# Patient Record
Sex: Female | Born: 1935 | Race: Black or African American | Hispanic: No | Smoking: Never smoker
Health system: Southern US, Community
[De-identification: ages and names within clinical notes are randomized; demographics above are authoritative.]

## PROBLEM LIST (undated history)

## (undated) DIAGNOSIS — H409 Unspecified glaucoma: Secondary | ICD-10-CM

## (undated) DIAGNOSIS — I10 Essential (primary) hypertension: Secondary | ICD-10-CM

## (undated) HISTORY — DX: Unspecified glaucoma: H40.9

## (undated) HISTORY — DX: Essential (primary) hypertension: I10

---

## 2016-02-15 LAB — BASIC METABOLIC PANEL
BUN: 21 (ref 4–21)
Creatinine: 1 (ref 0.5–1.1)
GLUCOSE: 89
Potassium: 3.4 (ref 3.4–5.3)
SODIUM: 143 (ref 137–147)

## 2016-02-15 LAB — HEPATIC FUNCTION PANEL
ALK PHOS: 55 (ref 25–125)
ALT: 7 (ref 7–35)
AST: 16 (ref 13–35)
BILIRUBIN, TOTAL: 0.8

## 2016-02-15 LAB — CBC AND DIFFERENTIAL
HEMATOCRIT: 29 — AB (ref 36–46)
HEMOGLOBIN: 9.6 — AB (ref 12.0–16.0)
NEUTROS ABS: 2766
Platelets: 300 (ref 150–399)
WBC: 5.2

## 2017-10-10 ENCOUNTER — Encounter: Payer: Self-pay | Admitting: Internal Medicine

## 2017-10-10 ENCOUNTER — Ambulatory Visit (INDEPENDENT_AMBULATORY_CARE_PROVIDER_SITE_OTHER): Payer: Medicare Other | Admitting: Internal Medicine

## 2017-10-10 VITALS — BP 120/62 | HR 66 | Temp 98.8°F | Ht 60.0 in | Wt 108.0 lb

## 2017-10-10 DIAGNOSIS — Z23 Encounter for immunization: Secondary | ICD-10-CM | POA: Diagnosis not present

## 2017-10-10 DIAGNOSIS — F0280 Dementia in other diseases classified elsewhere without behavioral disturbance: Secondary | ICD-10-CM | POA: Diagnosis not present

## 2017-10-10 DIAGNOSIS — I10 Essential (primary) hypertension: Secondary | ICD-10-CM

## 2017-10-10 DIAGNOSIS — G301 Alzheimer's disease with late onset: Secondary | ICD-10-CM

## 2017-10-10 DIAGNOSIS — F0390 Unspecified dementia without behavioral disturbance: Secondary | ICD-10-CM | POA: Insufficient documentation

## 2017-10-10 NOTE — Patient Instructions (Signed)
We have given you the flu shot today.   We will see you back in about 6-12 months for a check in.   Consider reading the 72 hour day about dementia if you have not already.

## 2017-10-10 NOTE — Progress Notes (Signed)
   Subjective:    Patient ID: Sheila Ford, female    DOB: 1921/11/27, 84 y.o.   MRN: [id number]  HPI The patient is a new 84 YO female coming in for continuation of her care. She does have high blood pressure and has been taking amlodipine for many years without problems. She does also have dementia which is moderately advanced. They do not have her on medication for this and do not want to start another medication today. She is present with her 2 daughters and lives with 1 daughter and they help to provide history. Denies any new concerns or symptoms. They only recall her complaining about shoulder pain and known arthritis there.   [facility], social history reviewed and updated.  Review of Systems  Unable to perform ROS: Dementia  Constitutional: Negative.   Respiratory: Negative for cough, chest tightness and shortness of breath.   Cardiovascular: Negative for chest pain, palpitations and leg swelling.  Gastrointestinal: Negative for abdominal distention, abdominal pain, constipation, diarrhea, nausea and vomiting.  Musculoskeletal: Positive for arthralgias.      Objective:   Physical Exam  Constitutional: She appears well-developed and well-nourished.  HENT:  Head: Normocephalic and atraumatic.  Eyes: EOM are normal.  Neck: Normal range of motion.  Cardiovascular: Normal rate and regular rhythm.  Pulmonary/Chest: Effort normal and breath sounds normal. No respiratory distress. She has no wheezes. She has no rales.  Abdominal: Soft. Bowel sounds are normal. She exhibits no distension. There is no tenderness. There is no rebound.  Musculoskeletal: She exhibits no edema.  Neurological: She is alert. Coordination normal.  Can follow 1 step direction.   Skin: Skin is warm and dry.  Psychiatric:  Does not follow conversation often, answers direct questions.    Vitals:   10/10/17 1034  BP: 120/62  Pulse: 66  Temp: 98.8 F (37.1 C)  TempSrc: Oral  Weight: 108 lb (49 kg)    Height: 5' (1.524 m)      Assessment & Plan:  Flu shot given at visit
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
## 2017-10-10 NOTE — Assessment & Plan Note (Signed)
Taking amlodipine and getting records with last EKG and labs from prior PCP.

## 2017-10-18 ENCOUNTER — Encounter: Payer: Self-pay | Admitting: Internal Medicine

## 2017-12-08 ENCOUNTER — Telehealth: Payer: Self-pay | Admitting: Internal Medicine

## 2017-12-08 NOTE — Telephone Encounter (Signed)
Done

## 2017-12-08 NOTE — Telephone Encounter (Signed)
LOV was on 10/10/17, Form has been placed in your box to review and advise on. Thank you.

## 2017-12-08 NOTE — Telephone Encounter (Signed)
Pt's Dtr, Jola BabinskiMarilyn, [facility].  Please call Jola BabinskiMarilyn to come pick up completed form [phone].  Form put in Brittany's box.

## 2017-12-08 NOTE — Telephone Encounter (Signed)
Patient informed the forms are completed & ready to be picked up.   Copy sent to scan.

## 2018-07-20 ENCOUNTER — Ambulatory Visit (INDEPENDENT_AMBULATORY_CARE_PROVIDER_SITE_OTHER): Payer: Medicare Other | Admitting: Internal Medicine

## 2018-07-20 ENCOUNTER — Encounter: Payer: Self-pay | Admitting: Internal Medicine

## 2018-07-20 VITALS — BP 112/70 | HR 66 | Temp 98.6°F | Ht 60.0 in | Wt 102.0 lb

## 2018-07-20 DIAGNOSIS — Z23 Encounter for immunization: Secondary | ICD-10-CM

## 2018-07-20 DIAGNOSIS — F0280 Dementia in other diseases classified elsewhere without behavioral disturbance: Secondary | ICD-10-CM | POA: Diagnosis not present

## 2018-07-20 DIAGNOSIS — G301 Alzheimer's disease with late onset: Secondary | ICD-10-CM

## 2018-07-20 NOTE — Addendum Note (Signed)
Addended by: Berton LanGULCH, Dafna Romo R on: 07/20/2018 02:38 PM   Modules accepted: Orders

## 2018-07-20 NOTE — Progress Notes (Signed)
   Subjective:    Patient ID: Sheila Ford, female    DOB: 03-18-1922, 84 y.o.   MRN: [id number]  HPI The patient is a 84 YO female coming in for concerns about her safety in living alone. Her daughter is present and helps to give all the history. She is having progression of dementia. They are just not able to stay with her all the time. She does not have problems with aggression or wandering. She is still able to dress and bathe herself as well as feed herself. Not incontinent.   Review of Systems  Unable to perform ROS: Dementia      Objective:   Physical Exam  Constitutional: She appears well-developed and well-nourished.  HENT:  Head: Normocephalic and atraumatic.  Eyes: EOM are normal.  Neck: Normal range of motion.  Cardiovascular: Normal rate and regular rhythm.  Pulmonary/Chest: Effort normal and breath sounds normal. No respiratory distress. She has no wheezes. She has no rales.  Abdominal: Soft. Bowel sounds are normal. She exhibits no distension. There is no tenderness. There is no rebound.  Musculoskeletal: She exhibits no edema.  Neurological: She is alert. Coordination normal.  Skin: Skin is warm and dry.   Vitals:   07/20/18 1329  BP: 112/70  Pulse: 66  Temp: 98.6 F (37 C)  TempSrc: Oral  SpO2: 98%  Weight: 102 lb (46.3 kg)  Height: 5' (1.524 m)      Assessment & Plan:

## 2018-07-20 NOTE — Assessment & Plan Note (Signed)
Worsening over the last year and she is needing more assistance with living. FL2 filled out and given to daughter at visit so they can apply to [facility].

## 2018-08-23 ENCOUNTER — Telehealth: Payer: Self-pay | Admitting: Internal Medicine

## 2018-08-23 NOTE — Telephone Encounter (Signed)
Patient's daughter informed that FL2 filled out and placed up front for pick up

## 2018-08-23 NOTE — Telephone Encounter (Signed)
Fine for [place] to fill out

## 2018-08-23 NOTE — Telephone Encounter (Signed)
Placed in MD basket to finish and sign

## 2018-08-23 NOTE — Telephone Encounter (Signed)
Copied from CRM [id number]. Topic: General - Inquiry >> Aug 23, 2018  8:54 AM Maia Petties wrote: Reason for CRM: Pt daughter, Jola Babinski, called stating pt will be going to [facility] memory care unit. She is needing a new FL2 because it is only good for 30 days and was completed on 07/20/2018. She is asking if Dr. Okey Dupre can complete a new one and she will come to pick up when ready. Pt is hoping to be admitted to the facility 08/30/2018. Please call Jola Babinski at [phone].

## 2018-09-17 DIAGNOSIS — G301 Alzheimer's disease with late onset: Secondary | ICD-10-CM | POA: Diagnosis not present

## 2018-09-17 DIAGNOSIS — I10 Essential (primary) hypertension: Secondary | ICD-10-CM | POA: Diagnosis not present

## 2018-09-17 DIAGNOSIS — F0281 Dementia in other diseases classified elsewhere with behavioral disturbance: Secondary | ICD-10-CM | POA: Diagnosis not present

## 2018-10-03 DIAGNOSIS — Z961 Presence of intraocular lens: Secondary | ICD-10-CM | POA: Diagnosis not present

## 2018-10-03 DIAGNOSIS — H02105 Unspecified ectropion of left lower eyelid: Secondary | ICD-10-CM | POA: Diagnosis not present

## 2018-10-08 DIAGNOSIS — G301 Alzheimer's disease with late onset: Secondary | ICD-10-CM | POA: Diagnosis not present

## 2018-10-08 DIAGNOSIS — I10 Essential (primary) hypertension: Secondary | ICD-10-CM | POA: Diagnosis not present

## 2018-10-08 DIAGNOSIS — F0281 Dementia in other diseases classified elsewhere with behavioral disturbance: Secondary | ICD-10-CM | POA: Diagnosis not present

## 2018-10-11 ENCOUNTER — Ambulatory Visit (INDEPENDENT_AMBULATORY_CARE_PROVIDER_SITE_OTHER): Payer: Medicare Other | Admitting: Internal Medicine

## 2018-10-11 ENCOUNTER — Encounter: Payer: Self-pay | Admitting: Internal Medicine

## 2018-10-11 VITALS — BP 140/60 | HR 48 | Temp 98.6°F | Ht 60.0 in | Wt 102.0 lb

## 2018-10-11 DIAGNOSIS — Z0000 Encounter for general adult medical examination without abnormal findings: Secondary | ICD-10-CM | POA: Insufficient documentation

## 2018-10-11 DIAGNOSIS — F0280 Dementia in other diseases classified elsewhere without behavioral disturbance: Secondary | ICD-10-CM | POA: Diagnosis not present

## 2018-10-11 DIAGNOSIS — G301 Alzheimer's disease with late onset: Secondary | ICD-10-CM | POA: Diagnosis not present

## 2018-10-11 DIAGNOSIS — I10 Essential (primary) hypertension: Secondary | ICD-10-CM

## 2018-10-11 NOTE — Assessment & Plan Note (Signed)
Stable and in facility at this time. She is not on medication and they do not want to start.

## 2018-10-11 NOTE — Patient Instructions (Signed)

## 2018-10-11 NOTE — Assessment & Plan Note (Signed)
BP at goal on amlodipine 5 mg daily. No side effects and declines labs.

## 2018-10-11 NOTE — Progress Notes (Signed)
   Subjective:   Patient ID: Sheila Ford, female    DOB: [birth year], 85 y.o.   MRN: [id number]  HPI Here for medicare wellness, no new complaints. Please see A/P for status and treatment of chronic medical problems.   HPI #2: here for follow up blood pressure (taking amlodipine 5 mg daily, denies side effects, no chest pains or headaches per family), and dementia (moderate and worsening over the last year, moved into facility for help as living with daughter previously and they were unable to leave her alone anymore, doing well in facility and eating okay, no new complaints).   Diet: heart healthy Physical activity: sedentary Depression/mood screen: negative Hearing: intact to whispered voice, mild to moderate loss bilaterally Visual acuity: grossly normal with lens, performs annual eye exam  ADLs: capable, some assist with bathing Fall risk: low Home safety: poor but lives in facility to help with safety Cognitive evaluation: intact to name EOL planning: adv directives discussed, in place    Office Visit from 07/20/2018 in [facility]  PHQ-2 Total Score  0      I have personally reviewed and have noted 1. The patient's medical and social history - reviewed today no changes 2. Their use of alcohol, tobacco or illicit drugs 3. Their current medications and supplements 4. The patient's functional ability including ADL's, fall risks, home safety risks and hearing or visual impairment. 5. Diet and physical activities 6. Evidence for depression or mood disorders 7. Care team reviewed and updated (available in snapshot)  Review of Systems  Unable to perform ROS: Dementia    Objective:  Physical Exam Constitutional:      Appearance: She is well-developed.  HENT:     Head: Normocephalic and atraumatic.  Neck:     Musculoskeletal: Normal range of motion.  Cardiovascular:     Rate and Rhythm: Normal rate and regular rhythm.  Pulmonary:     Effort:  Pulmonary effort is normal. No respiratory distress.     Breath sounds: Normal breath sounds. No wheezing or rales.  Abdominal:     General: Bowel sounds are normal. There is no distension.     Palpations: Abdomen is soft.     Tenderness: There is no abdominal tenderness. There is no rebound.  Skin:    General: Skin is warm and dry.  Neurological:     Mental Status: She is alert. Mental status is at baseline.     Coordination: Coordination normal.     Vitals:   10/11/18 1041  BP: 140/60  Pulse: (!) 48  Temp: 98.6 F (37 C)  TempSrc: Oral  SpO2: 99%  Weight: 102 lb (46.3 kg)  Height: 5' (1.524 m)    Assessment & Plan:

## 2018-10-11 NOTE — Assessment & Plan Note (Signed)
Flu shot up to date. Pneumonia declines. Shingrix declines. Tetanus declines. Colonoscopy aged out. Mammogram aged out, pap smear aged out and dexa declines. Counseled about sun safety and mole surveillance. Counseled about the dangers of distracted driving. Given 10 year screening recommendations.  

## 2018-10-12 DIAGNOSIS — M6281 Muscle weakness (generalized): Secondary | ICD-10-CM | POA: Diagnosis not present

## 2018-10-12 DIAGNOSIS — R293 Abnormal posture: Secondary | ICD-10-CM | POA: Diagnosis not present

## 2018-10-12 DIAGNOSIS — R278 Other lack of coordination: Secondary | ICD-10-CM | POA: Diagnosis not present

## 2018-10-16 DIAGNOSIS — M6281 Muscle weakness (generalized): Secondary | ICD-10-CM | POA: Diagnosis not present

## 2018-10-16 DIAGNOSIS — R293 Abnormal posture: Secondary | ICD-10-CM | POA: Diagnosis not present

## 2018-10-16 DIAGNOSIS — R419 Unspecified symptoms and signs involving cognitive functions and awareness: Secondary | ICD-10-CM | POA: Diagnosis not present

## 2018-10-16 DIAGNOSIS — R278 Other lack of coordination: Secondary | ICD-10-CM | POA: Diagnosis not present

## 2018-10-16 DIAGNOSIS — F419 Anxiety disorder, unspecified: Secondary | ICD-10-CM | POA: Diagnosis not present

## 2018-10-17 DIAGNOSIS — R293 Abnormal posture: Secondary | ICD-10-CM | POA: Diagnosis not present

## 2018-10-17 DIAGNOSIS — M6281 Muscle weakness (generalized): Secondary | ICD-10-CM | POA: Diagnosis not present

## 2018-10-17 DIAGNOSIS — R278 Other lack of coordination: Secondary | ICD-10-CM | POA: Diagnosis not present

## 2018-10-18 DIAGNOSIS — R293 Abnormal posture: Secondary | ICD-10-CM | POA: Diagnosis not present

## 2018-10-18 DIAGNOSIS — M6281 Muscle weakness (generalized): Secondary | ICD-10-CM | POA: Diagnosis not present

## 2018-10-18 DIAGNOSIS — R278 Other lack of coordination: Secondary | ICD-10-CM | POA: Diagnosis not present

## 2018-10-19 DIAGNOSIS — R2681 Unsteadiness on feet: Secondary | ICD-10-CM | POA: Diagnosis not present

## 2018-10-19 DIAGNOSIS — M6281 Muscle weakness (generalized): Secondary | ICD-10-CM | POA: Diagnosis not present

## 2018-10-22 DIAGNOSIS — I10 Essential (primary) hypertension: Secondary | ICD-10-CM | POA: Diagnosis not present

## 2018-10-22 DIAGNOSIS — G301 Alzheimer's disease with late onset: Secondary | ICD-10-CM | POA: Diagnosis not present

## 2018-10-22 DIAGNOSIS — R2681 Unsteadiness on feet: Secondary | ICD-10-CM | POA: Diagnosis not present

## 2018-10-22 DIAGNOSIS — M6281 Muscle weakness (generalized): Secondary | ICD-10-CM | POA: Diagnosis not present

## 2018-10-22 DIAGNOSIS — F0281 Dementia in other diseases classified elsewhere with behavioral disturbance: Secondary | ICD-10-CM | POA: Diagnosis not present

## 2018-10-22 DIAGNOSIS — R278 Other lack of coordination: Secondary | ICD-10-CM | POA: Diagnosis not present

## 2018-10-22 DIAGNOSIS — R293 Abnormal posture: Secondary | ICD-10-CM | POA: Diagnosis not present

## 2018-10-23 DIAGNOSIS — R278 Other lack of coordination: Secondary | ICD-10-CM | POA: Diagnosis not present

## 2018-10-23 DIAGNOSIS — R2681 Unsteadiness on feet: Secondary | ICD-10-CM | POA: Diagnosis not present

## 2018-10-23 DIAGNOSIS — R293 Abnormal posture: Secondary | ICD-10-CM | POA: Diagnosis not present

## 2018-10-23 DIAGNOSIS — M6281 Muscle weakness (generalized): Secondary | ICD-10-CM | POA: Diagnosis not present

## 2018-10-25 DIAGNOSIS — R293 Abnormal posture: Secondary | ICD-10-CM | POA: Diagnosis not present

## 2018-10-25 DIAGNOSIS — R278 Other lack of coordination: Secondary | ICD-10-CM | POA: Diagnosis not present

## 2018-10-25 DIAGNOSIS — M6281 Muscle weakness (generalized): Secondary | ICD-10-CM | POA: Diagnosis not present

## 2018-10-29 DIAGNOSIS — R293 Abnormal posture: Secondary | ICD-10-CM | POA: Diagnosis not present

## 2018-10-29 DIAGNOSIS — M6281 Muscle weakness (generalized): Secondary | ICD-10-CM | POA: Diagnosis not present

## 2018-10-29 DIAGNOSIS — R2681 Unsteadiness on feet: Secondary | ICD-10-CM | POA: Diagnosis not present

## 2018-10-29 DIAGNOSIS — R278 Other lack of coordination: Secondary | ICD-10-CM | POA: Diagnosis not present

## 2018-10-30 DIAGNOSIS — R278 Other lack of coordination: Secondary | ICD-10-CM | POA: Diagnosis not present

## 2018-10-30 DIAGNOSIS — R293 Abnormal posture: Secondary | ICD-10-CM | POA: Diagnosis not present

## 2018-10-30 DIAGNOSIS — M6281 Muscle weakness (generalized): Secondary | ICD-10-CM | POA: Diagnosis not present

## 2018-10-30 DIAGNOSIS — R2681 Unsteadiness on feet: Secondary | ICD-10-CM | POA: Diagnosis not present

## 2018-11-01 DIAGNOSIS — R2681 Unsteadiness on feet: Secondary | ICD-10-CM | POA: Diagnosis not present

## 2018-11-01 DIAGNOSIS — R278 Other lack of coordination: Secondary | ICD-10-CM | POA: Diagnosis not present

## 2018-11-01 DIAGNOSIS — M6281 Muscle weakness (generalized): Secondary | ICD-10-CM | POA: Diagnosis not present

## 2018-11-01 DIAGNOSIS — R293 Abnormal posture: Secondary | ICD-10-CM | POA: Diagnosis not present

## 2018-11-05 DIAGNOSIS — R293 Abnormal posture: Secondary | ICD-10-CM | POA: Diagnosis not present

## 2018-11-05 DIAGNOSIS — M6281 Muscle weakness (generalized): Secondary | ICD-10-CM | POA: Diagnosis not present

## 2018-11-05 DIAGNOSIS — R278 Other lack of coordination: Secondary | ICD-10-CM | POA: Diagnosis not present

## 2018-11-05 DIAGNOSIS — R2681 Unsteadiness on feet: Secondary | ICD-10-CM | POA: Diagnosis not present

## 2018-11-06 DIAGNOSIS — R278 Other lack of coordination: Secondary | ICD-10-CM | POA: Diagnosis not present

## 2018-11-06 DIAGNOSIS — R293 Abnormal posture: Secondary | ICD-10-CM | POA: Diagnosis not present

## 2018-11-06 DIAGNOSIS — M6281 Muscle weakness (generalized): Secondary | ICD-10-CM | POA: Diagnosis not present

## 2018-11-06 DIAGNOSIS — R2681 Unsteadiness on feet: Secondary | ICD-10-CM | POA: Diagnosis not present

## 2018-11-08 DIAGNOSIS — M6281 Muscle weakness (generalized): Secondary | ICD-10-CM | POA: Diagnosis not present

## 2018-11-08 DIAGNOSIS — R2681 Unsteadiness on feet: Secondary | ICD-10-CM | POA: Diagnosis not present

## 2018-11-09 DIAGNOSIS — R293 Abnormal posture: Secondary | ICD-10-CM | POA: Diagnosis not present

## 2018-11-09 DIAGNOSIS — R2681 Unsteadiness on feet: Secondary | ICD-10-CM | POA: Diagnosis not present

## 2018-11-09 DIAGNOSIS — M6281 Muscle weakness (generalized): Secondary | ICD-10-CM | POA: Diagnosis not present

## 2018-11-09 DIAGNOSIS — R278 Other lack of coordination: Secondary | ICD-10-CM | POA: Diagnosis not present

## 2018-11-12 DIAGNOSIS — M6281 Muscle weakness (generalized): Secondary | ICD-10-CM | POA: Diagnosis not present

## 2018-11-12 DIAGNOSIS — R2681 Unsteadiness on feet: Secondary | ICD-10-CM | POA: Diagnosis not present

## 2018-11-13 DIAGNOSIS — M6281 Muscle weakness (generalized): Secondary | ICD-10-CM | POA: Diagnosis not present

## 2018-11-13 DIAGNOSIS — R278 Other lack of coordination: Secondary | ICD-10-CM | POA: Diagnosis not present

## 2018-11-13 DIAGNOSIS — R2681 Unsteadiness on feet: Secondary | ICD-10-CM | POA: Diagnosis not present

## 2018-11-13 DIAGNOSIS — R293 Abnormal posture: Secondary | ICD-10-CM | POA: Diagnosis not present

## 2018-11-14 DIAGNOSIS — R293 Abnormal posture: Secondary | ICD-10-CM | POA: Diagnosis not present

## 2018-11-14 DIAGNOSIS — R278 Other lack of coordination: Secondary | ICD-10-CM | POA: Diagnosis not present

## 2018-11-14 DIAGNOSIS — M6281 Muscle weakness (generalized): Secondary | ICD-10-CM | POA: Diagnosis not present

## 2018-11-20 DIAGNOSIS — R278 Other lack of coordination: Secondary | ICD-10-CM | POA: Diagnosis not present

## 2018-11-20 DIAGNOSIS — R2681 Unsteadiness on feet: Secondary | ICD-10-CM | POA: Diagnosis not present

## 2018-11-20 DIAGNOSIS — R293 Abnormal posture: Secondary | ICD-10-CM | POA: Diagnosis not present

## 2018-11-20 DIAGNOSIS — M6281 Muscle weakness (generalized): Secondary | ICD-10-CM | POA: Diagnosis not present

## 2018-11-21 DIAGNOSIS — M6281 Muscle weakness (generalized): Secondary | ICD-10-CM | POA: Diagnosis not present

## 2018-11-21 DIAGNOSIS — R293 Abnormal posture: Secondary | ICD-10-CM | POA: Diagnosis not present

## 2018-11-21 DIAGNOSIS — R2681 Unsteadiness on feet: Secondary | ICD-10-CM | POA: Diagnosis not present

## 2018-11-21 DIAGNOSIS — R278 Other lack of coordination: Secondary | ICD-10-CM | POA: Diagnosis not present

## 2018-11-22 DIAGNOSIS — R293 Abnormal posture: Secondary | ICD-10-CM | POA: Diagnosis not present

## 2018-11-22 DIAGNOSIS — M6281 Muscle weakness (generalized): Secondary | ICD-10-CM | POA: Diagnosis not present

## 2018-11-22 DIAGNOSIS — R2681 Unsteadiness on feet: Secondary | ICD-10-CM | POA: Diagnosis not present

## 2018-11-22 DIAGNOSIS — R278 Other lack of coordination: Secondary | ICD-10-CM | POA: Diagnosis not present

## 2018-11-23 DIAGNOSIS — R293 Abnormal posture: Secondary | ICD-10-CM | POA: Diagnosis not present

## 2018-11-23 DIAGNOSIS — R278 Other lack of coordination: Secondary | ICD-10-CM | POA: Diagnosis not present

## 2018-11-23 DIAGNOSIS — M6281 Muscle weakness (generalized): Secondary | ICD-10-CM | POA: Diagnosis not present

## 2018-11-26 DIAGNOSIS — M6281 Muscle weakness (generalized): Secondary | ICD-10-CM | POA: Diagnosis not present

## 2018-11-26 DIAGNOSIS — R2681 Unsteadiness on feet: Secondary | ICD-10-CM | POA: Diagnosis not present

## 2018-11-27 DIAGNOSIS — R278 Other lack of coordination: Secondary | ICD-10-CM | POA: Diagnosis not present

## 2018-11-27 DIAGNOSIS — R2681 Unsteadiness on feet: Secondary | ICD-10-CM | POA: Diagnosis not present

## 2018-11-27 DIAGNOSIS — R293 Abnormal posture: Secondary | ICD-10-CM | POA: Diagnosis not present

## 2018-11-27 DIAGNOSIS — M6281 Muscle weakness (generalized): Secondary | ICD-10-CM | POA: Diagnosis not present

## 2018-11-29 DIAGNOSIS — R2681 Unsteadiness on feet: Secondary | ICD-10-CM | POA: Diagnosis not present

## 2018-11-29 DIAGNOSIS — R278 Other lack of coordination: Secondary | ICD-10-CM | POA: Diagnosis not present

## 2018-11-29 DIAGNOSIS — M6281 Muscle weakness (generalized): Secondary | ICD-10-CM | POA: Diagnosis not present

## 2018-11-29 DIAGNOSIS — R293 Abnormal posture: Secondary | ICD-10-CM | POA: Diagnosis not present

## 2018-11-30 DIAGNOSIS — R293 Abnormal posture: Secondary | ICD-10-CM | POA: Diagnosis not present

## 2018-11-30 DIAGNOSIS — R278 Other lack of coordination: Secondary | ICD-10-CM | POA: Diagnosis not present

## 2018-11-30 DIAGNOSIS — M6281 Muscle weakness (generalized): Secondary | ICD-10-CM | POA: Diagnosis not present

## 2018-12-03 DIAGNOSIS — R2681 Unsteadiness on feet: Secondary | ICD-10-CM | POA: Diagnosis not present

## 2018-12-03 DIAGNOSIS — M6281 Muscle weakness (generalized): Secondary | ICD-10-CM | POA: Diagnosis not present

## 2018-12-04 DIAGNOSIS — R419 Unspecified symptoms and signs involving cognitive functions and awareness: Secondary | ICD-10-CM | POA: Diagnosis not present

## 2018-12-04 DIAGNOSIS — F419 Anxiety disorder, unspecified: Secondary | ICD-10-CM | POA: Diagnosis not present

## 2018-12-04 DIAGNOSIS — M6281 Muscle weakness (generalized): Secondary | ICD-10-CM | POA: Diagnosis not present

## 2018-12-04 DIAGNOSIS — R278 Other lack of coordination: Secondary | ICD-10-CM | POA: Diagnosis not present

## 2018-12-04 DIAGNOSIS — R2681 Unsteadiness on feet: Secondary | ICD-10-CM | POA: Diagnosis not present

## 2018-12-04 DIAGNOSIS — R293 Abnormal posture: Secondary | ICD-10-CM | POA: Diagnosis not present

## 2018-12-05 DIAGNOSIS — M6281 Muscle weakness (generalized): Secondary | ICD-10-CM | POA: Diagnosis not present

## 2018-12-05 DIAGNOSIS — M79674 Pain in right toe(s): Secondary | ICD-10-CM | POA: Diagnosis not present

## 2018-12-05 DIAGNOSIS — R278 Other lack of coordination: Secondary | ICD-10-CM | POA: Diagnosis not present

## 2018-12-05 DIAGNOSIS — R293 Abnormal posture: Secondary | ICD-10-CM | POA: Diagnosis not present

## 2018-12-05 DIAGNOSIS — B351 Tinea unguium: Secondary | ICD-10-CM | POA: Diagnosis not present

## 2018-12-05 DIAGNOSIS — M79675 Pain in left toe(s): Secondary | ICD-10-CM | POA: Diagnosis not present

## 2018-12-06 DIAGNOSIS — R2681 Unsteadiness on feet: Secondary | ICD-10-CM | POA: Diagnosis not present

## 2018-12-06 DIAGNOSIS — M6281 Muscle weakness (generalized): Secondary | ICD-10-CM | POA: Diagnosis not present

## 2018-12-07 DIAGNOSIS — M6281 Muscle weakness (generalized): Secondary | ICD-10-CM | POA: Diagnosis not present

## 2018-12-07 DIAGNOSIS — R293 Abnormal posture: Secondary | ICD-10-CM | POA: Diagnosis not present

## 2018-12-07 DIAGNOSIS — R278 Other lack of coordination: Secondary | ICD-10-CM | POA: Diagnosis not present

## 2018-12-10 DIAGNOSIS — R293 Abnormal posture: Secondary | ICD-10-CM | POA: Diagnosis not present

## 2018-12-10 DIAGNOSIS — R2681 Unsteadiness on feet: Secondary | ICD-10-CM | POA: Diagnosis not present

## 2018-12-10 DIAGNOSIS — R278 Other lack of coordination: Secondary | ICD-10-CM | POA: Diagnosis not present

## 2018-12-10 DIAGNOSIS — M6281 Muscle weakness (generalized): Secondary | ICD-10-CM | POA: Diagnosis not present

## 2018-12-11 DIAGNOSIS — M6281 Muscle weakness (generalized): Secondary | ICD-10-CM | POA: Diagnosis not present

## 2018-12-11 DIAGNOSIS — R278 Other lack of coordination: Secondary | ICD-10-CM | POA: Diagnosis not present

## 2018-12-11 DIAGNOSIS — R2681 Unsteadiness on feet: Secondary | ICD-10-CM | POA: Diagnosis not present

## 2018-12-11 DIAGNOSIS — R293 Abnormal posture: Secondary | ICD-10-CM | POA: Diagnosis not present

## 2018-12-13 ENCOUNTER — Other Ambulatory Visit: Payer: Self-pay | Admitting: Internal Medicine

## 2018-12-13 DIAGNOSIS — R2681 Unsteadiness on feet: Secondary | ICD-10-CM | POA: Diagnosis not present

## 2018-12-13 DIAGNOSIS — M6281 Muscle weakness (generalized): Secondary | ICD-10-CM | POA: Diagnosis not present

## 2018-12-13 MED ORDER — AMLODIPINE BESYLATE 5 MG PO TABS: 10.0000 mg | ORAL_TABLET | Freq: Every day | ORAL | 1 refills | Status: DC

## 2018-12-13 NOTE — Telephone Encounter (Signed)
Copied from CRM [id number]. Topic: Quick Communication - Rx Refill/Question >> Dec 13, 2018 10:53 AM Dalphine Handing A wrote: Medication: amLODipine (NORVASC) 5 MG tablet  Has the patient contacted their pharmacy? Yes (Agent: If no, request that the patient contact the pharmacy for the refill.) (Agent: If yes, when and what did the pharmacy advise?) Contact PCP  Preferred Pharmacy (with phone number or street name): [id number] [place], [place] - [address] [phone] (Phone) [phone] (Fax)    Agent: Please be advised that RX refills may take up to 3 business days. We ask that you follow-up with your pharmacy.

## 2018-12-13 NOTE — Telephone Encounter (Signed)
Is it okay to refill?

## 2018-12-17 DIAGNOSIS — M6281 Muscle weakness (generalized): Secondary | ICD-10-CM | POA: Diagnosis not present

## 2018-12-17 DIAGNOSIS — R278 Other lack of coordination: Secondary | ICD-10-CM | POA: Diagnosis not present

## 2018-12-17 DIAGNOSIS — R293 Abnormal posture: Secondary | ICD-10-CM | POA: Diagnosis not present

## 2018-12-18 DIAGNOSIS — R2681 Unsteadiness on feet: Secondary | ICD-10-CM | POA: Diagnosis not present

## 2018-12-18 DIAGNOSIS — M6281 Muscle weakness (generalized): Secondary | ICD-10-CM | POA: Diagnosis not present

## 2018-12-18 DIAGNOSIS — R293 Abnormal posture: Secondary | ICD-10-CM | POA: Diagnosis not present

## 2018-12-18 DIAGNOSIS — R278 Other lack of coordination: Secondary | ICD-10-CM | POA: Diagnosis not present

## 2018-12-19 DIAGNOSIS — R278 Other lack of coordination: Secondary | ICD-10-CM | POA: Diagnosis not present

## 2018-12-19 DIAGNOSIS — M6281 Muscle weakness (generalized): Secondary | ICD-10-CM | POA: Diagnosis not present

## 2018-12-19 DIAGNOSIS — R293 Abnormal posture: Secondary | ICD-10-CM | POA: Diagnosis not present

## 2018-12-20 DIAGNOSIS — M6281 Muscle weakness (generalized): Secondary | ICD-10-CM | POA: Diagnosis not present

## 2018-12-20 DIAGNOSIS — R2681 Unsteadiness on feet: Secondary | ICD-10-CM | POA: Diagnosis not present

## 2018-12-21 DIAGNOSIS — R2681 Unsteadiness on feet: Secondary | ICD-10-CM | POA: Diagnosis not present

## 2018-12-21 DIAGNOSIS — M6281 Muscle weakness (generalized): Secondary | ICD-10-CM | POA: Diagnosis not present

## 2018-12-24 DIAGNOSIS — R278 Other lack of coordination: Secondary | ICD-10-CM | POA: Diagnosis not present

## 2018-12-24 DIAGNOSIS — R293 Abnormal posture: Secondary | ICD-10-CM | POA: Diagnosis not present

## 2018-12-24 DIAGNOSIS — M6281 Muscle weakness (generalized): Secondary | ICD-10-CM | POA: Diagnosis not present

## 2018-12-24 DIAGNOSIS — R2681 Unsteadiness on feet: Secondary | ICD-10-CM | POA: Diagnosis not present

## 2018-12-25 DIAGNOSIS — R278 Other lack of coordination: Secondary | ICD-10-CM | POA: Diagnosis not present

## 2018-12-25 DIAGNOSIS — R293 Abnormal posture: Secondary | ICD-10-CM | POA: Diagnosis not present

## 2018-12-25 DIAGNOSIS — M6281 Muscle weakness (generalized): Secondary | ICD-10-CM | POA: Diagnosis not present

## 2018-12-25 DIAGNOSIS — R2681 Unsteadiness on feet: Secondary | ICD-10-CM | POA: Diagnosis not present

## 2018-12-27 DIAGNOSIS — R2681 Unsteadiness on feet: Secondary | ICD-10-CM | POA: Diagnosis not present

## 2018-12-27 DIAGNOSIS — R293 Abnormal posture: Secondary | ICD-10-CM | POA: Diagnosis not present

## 2018-12-27 DIAGNOSIS — M6281 Muscle weakness (generalized): Secondary | ICD-10-CM | POA: Diagnosis not present

## 2018-12-27 DIAGNOSIS — R278 Other lack of coordination: Secondary | ICD-10-CM | POA: Diagnosis not present

## 2018-12-31 DIAGNOSIS — R278 Other lack of coordination: Secondary | ICD-10-CM | POA: Diagnosis not present

## 2018-12-31 DIAGNOSIS — R293 Abnormal posture: Secondary | ICD-10-CM | POA: Diagnosis not present

## 2018-12-31 DIAGNOSIS — M6281 Muscle weakness (generalized): Secondary | ICD-10-CM | POA: Diagnosis not present

## 2018-12-31 DIAGNOSIS — R2681 Unsteadiness on feet: Secondary | ICD-10-CM | POA: Diagnosis not present

## 2019-01-01 DIAGNOSIS — M6281 Muscle weakness (generalized): Secondary | ICD-10-CM | POA: Diagnosis not present

## 2019-01-01 DIAGNOSIS — R278 Other lack of coordination: Secondary | ICD-10-CM | POA: Diagnosis not present

## 2019-01-01 DIAGNOSIS — R2681 Unsteadiness on feet: Secondary | ICD-10-CM | POA: Diagnosis not present

## 2019-01-01 DIAGNOSIS — R293 Abnormal posture: Secondary | ICD-10-CM | POA: Diagnosis not present

## 2019-01-02 DIAGNOSIS — R2681 Unsteadiness on feet: Secondary | ICD-10-CM | POA: Diagnosis not present

## 2019-01-02 DIAGNOSIS — M6281 Muscle weakness (generalized): Secondary | ICD-10-CM | POA: Diagnosis not present

## 2019-01-03 DIAGNOSIS — M6281 Muscle weakness (generalized): Secondary | ICD-10-CM | POA: Diagnosis not present

## 2019-01-03 DIAGNOSIS — R278 Other lack of coordination: Secondary | ICD-10-CM | POA: Diagnosis not present

## 2019-01-03 DIAGNOSIS — R293 Abnormal posture: Secondary | ICD-10-CM | POA: Diagnosis not present

## 2019-01-04 DIAGNOSIS — M6281 Muscle weakness (generalized): Secondary | ICD-10-CM | POA: Diagnosis not present

## 2019-01-04 DIAGNOSIS — R293 Abnormal posture: Secondary | ICD-10-CM | POA: Diagnosis not present

## 2019-01-04 DIAGNOSIS — R278 Other lack of coordination: Secondary | ICD-10-CM | POA: Diagnosis not present

## 2019-01-07 DIAGNOSIS — M6281 Muscle weakness (generalized): Secondary | ICD-10-CM | POA: Diagnosis not present

## 2019-01-07 DIAGNOSIS — R2681 Unsteadiness on feet: Secondary | ICD-10-CM | POA: Diagnosis not present

## 2019-01-07 DIAGNOSIS — R293 Abnormal posture: Secondary | ICD-10-CM | POA: Diagnosis not present

## 2019-01-07 DIAGNOSIS — R278 Other lack of coordination: Secondary | ICD-10-CM | POA: Diagnosis not present

## 2019-01-08 DIAGNOSIS — R2681 Unsteadiness on feet: Secondary | ICD-10-CM | POA: Diagnosis not present

## 2019-01-08 DIAGNOSIS — M6281 Muscle weakness (generalized): Secondary | ICD-10-CM | POA: Diagnosis not present

## 2019-01-09 DIAGNOSIS — R278 Other lack of coordination: Secondary | ICD-10-CM | POA: Diagnosis not present

## 2019-01-09 DIAGNOSIS — M6281 Muscle weakness (generalized): Secondary | ICD-10-CM | POA: Diagnosis not present

## 2019-01-09 DIAGNOSIS — R293 Abnormal posture: Secondary | ICD-10-CM | POA: Diagnosis not present

## 2019-01-10 DIAGNOSIS — R2681 Unsteadiness on feet: Secondary | ICD-10-CM | POA: Diagnosis not present

## 2019-01-10 DIAGNOSIS — M6281 Muscle weakness (generalized): Secondary | ICD-10-CM | POA: Diagnosis not present

## 2019-01-11 DIAGNOSIS — R293 Abnormal posture: Secondary | ICD-10-CM | POA: Diagnosis not present

## 2019-01-11 DIAGNOSIS — M6281 Muscle weakness (generalized): Secondary | ICD-10-CM | POA: Diagnosis not present

## 2019-01-11 DIAGNOSIS — R278 Other lack of coordination: Secondary | ICD-10-CM | POA: Diagnosis not present

## 2019-01-14 DIAGNOSIS — M6281 Muscle weakness (generalized): Secondary | ICD-10-CM | POA: Diagnosis not present

## 2019-01-14 DIAGNOSIS — R2681 Unsteadiness on feet: Secondary | ICD-10-CM | POA: Diagnosis not present

## 2019-01-15 ENCOUNTER — Telehealth: Payer: Self-pay | Admitting: Internal Medicine

## 2019-01-15 DIAGNOSIS — R278 Other lack of coordination: Secondary | ICD-10-CM | POA: Diagnosis not present

## 2019-01-15 DIAGNOSIS — R419 Unspecified symptoms and signs involving cognitive functions and awareness: Secondary | ICD-10-CM | POA: Diagnosis not present

## 2019-01-15 DIAGNOSIS — F419 Anxiety disorder, unspecified: Secondary | ICD-10-CM | POA: Diagnosis not present

## 2019-01-15 DIAGNOSIS — R2681 Unsteadiness on feet: Secondary | ICD-10-CM | POA: Diagnosis not present

## 2019-01-15 DIAGNOSIS — R293 Abnormal posture: Secondary | ICD-10-CM | POA: Diagnosis not present

## 2019-01-15 DIAGNOSIS — M6281 Muscle weakness (generalized): Secondary | ICD-10-CM | POA: Diagnosis not present

## 2019-01-15 NOTE — Telephone Encounter (Signed)
Copied from CRM [id number]. Topic: Quick Communication - See Telephone Encounter >> Jan 15, 2019  9:11 AM Sheila Ford wrote: CRM for notification. See Telephone encounter for: 01/15/19. Pt lives in memory care facility and glasses have been lost. Pt daughter bought her new glasses and has taken them to her. If they can get a doctors order that pt needs to wear glasses at all times. This way they will remove them at night and put in the med cart/tray so they may be able to keep up with them better for the pt. Pt is in Sheila Ford. Please call Sheila Ford), daughter, to advise. Phone # [phone].

## 2019-01-15 NOTE — Telephone Encounter (Signed)
Called the facility [phone] and talked to the nurse and they will talk to the doctors to see if they can put the glasses on the med cart at night and give in the am. States that sometimes the glasses get lost during that time but will try and help.

## 2019-01-15 NOTE — Telephone Encounter (Signed)
Okay to give that verbal order

## 2019-01-16 DIAGNOSIS — M6281 Muscle weakness (generalized): Secondary | ICD-10-CM | POA: Diagnosis not present

## 2019-01-16 DIAGNOSIS — R278 Other lack of coordination: Secondary | ICD-10-CM | POA: Diagnosis not present

## 2019-01-16 DIAGNOSIS — R293 Abnormal posture: Secondary | ICD-10-CM | POA: Diagnosis not present

## 2019-01-17 DIAGNOSIS — M6281 Muscle weakness (generalized): Secondary | ICD-10-CM | POA: Diagnosis not present

## 2019-01-17 DIAGNOSIS — R2681 Unsteadiness on feet: Secondary | ICD-10-CM | POA: Diagnosis not present

## 2019-01-17 DIAGNOSIS — R293 Abnormal posture: Secondary | ICD-10-CM | POA: Diagnosis not present

## 2019-01-17 DIAGNOSIS — R278 Other lack of coordination: Secondary | ICD-10-CM | POA: Diagnosis not present

## 2019-01-21 DIAGNOSIS — M6281 Muscle weakness (generalized): Secondary | ICD-10-CM | POA: Diagnosis not present

## 2019-01-21 DIAGNOSIS — R278 Other lack of coordination: Secondary | ICD-10-CM | POA: Diagnosis not present

## 2019-01-21 DIAGNOSIS — R293 Abnormal posture: Secondary | ICD-10-CM | POA: Diagnosis not present

## 2019-01-22 DIAGNOSIS — R293 Abnormal posture: Secondary | ICD-10-CM | POA: Diagnosis not present

## 2019-01-22 DIAGNOSIS — R278 Other lack of coordination: Secondary | ICD-10-CM | POA: Diagnosis not present

## 2019-01-22 DIAGNOSIS — M6281 Muscle weakness (generalized): Secondary | ICD-10-CM | POA: Diagnosis not present

## 2019-01-23 DIAGNOSIS — M6281 Muscle weakness (generalized): Secondary | ICD-10-CM | POA: Diagnosis not present

## 2019-01-23 DIAGNOSIS — R2681 Unsteadiness on feet: Secondary | ICD-10-CM | POA: Diagnosis not present

## 2019-01-24 DIAGNOSIS — M6281 Muscle weakness (generalized): Secondary | ICD-10-CM | POA: Diagnosis not present

## 2019-01-24 DIAGNOSIS — R293 Abnormal posture: Secondary | ICD-10-CM | POA: Diagnosis not present

## 2019-01-24 DIAGNOSIS — R278 Other lack of coordination: Secondary | ICD-10-CM | POA: Diagnosis not present

## 2019-01-24 DIAGNOSIS — R2681 Unsteadiness on feet: Secondary | ICD-10-CM | POA: Diagnosis not present

## 2019-01-28 DIAGNOSIS — R293 Abnormal posture: Secondary | ICD-10-CM | POA: Diagnosis not present

## 2019-01-28 DIAGNOSIS — R2681 Unsteadiness on feet: Secondary | ICD-10-CM | POA: Diagnosis not present

## 2019-01-28 DIAGNOSIS — M6281 Muscle weakness (generalized): Secondary | ICD-10-CM | POA: Diagnosis not present

## 2019-01-28 DIAGNOSIS — R278 Other lack of coordination: Secondary | ICD-10-CM | POA: Diagnosis not present

## 2019-01-29 DIAGNOSIS — M6281 Muscle weakness (generalized): Secondary | ICD-10-CM | POA: Diagnosis not present

## 2019-01-29 DIAGNOSIS — R278 Other lack of coordination: Secondary | ICD-10-CM | POA: Diagnosis not present

## 2019-01-29 DIAGNOSIS — R293 Abnormal posture: Secondary | ICD-10-CM | POA: Diagnosis not present

## 2019-01-29 DIAGNOSIS — R2681 Unsteadiness on feet: Secondary | ICD-10-CM | POA: Diagnosis not present

## 2019-01-31 DIAGNOSIS — R2681 Unsteadiness on feet: Secondary | ICD-10-CM | POA: Diagnosis not present

## 2019-01-31 DIAGNOSIS — M6281 Muscle weakness (generalized): Secondary | ICD-10-CM | POA: Diagnosis not present

## 2019-01-31 DIAGNOSIS — R293 Abnormal posture: Secondary | ICD-10-CM | POA: Diagnosis not present

## 2019-01-31 DIAGNOSIS — R278 Other lack of coordination: Secondary | ICD-10-CM | POA: Diagnosis not present

## 2019-02-01 DIAGNOSIS — R293 Abnormal posture: Secondary | ICD-10-CM | POA: Diagnosis not present

## 2019-02-01 DIAGNOSIS — M6281 Muscle weakness (generalized): Secondary | ICD-10-CM | POA: Diagnosis not present

## 2019-02-01 DIAGNOSIS — R278 Other lack of coordination: Secondary | ICD-10-CM | POA: Diagnosis not present

## 2019-02-04 DIAGNOSIS — M6281 Muscle weakness (generalized): Secondary | ICD-10-CM | POA: Diagnosis not present

## 2019-02-04 DIAGNOSIS — R2681 Unsteadiness on feet: Secondary | ICD-10-CM | POA: Diagnosis not present

## 2019-02-05 DIAGNOSIS — R278 Other lack of coordination: Secondary | ICD-10-CM | POA: Diagnosis not present

## 2019-02-05 DIAGNOSIS — M6281 Muscle weakness (generalized): Secondary | ICD-10-CM | POA: Diagnosis not present

## 2019-02-05 DIAGNOSIS — R293 Abnormal posture: Secondary | ICD-10-CM | POA: Diagnosis not present

## 2019-02-05 DIAGNOSIS — R2681 Unsteadiness on feet: Secondary | ICD-10-CM | POA: Diagnosis not present

## 2019-02-06 DIAGNOSIS — R278 Other lack of coordination: Secondary | ICD-10-CM | POA: Diagnosis not present

## 2019-02-06 DIAGNOSIS — M6281 Muscle weakness (generalized): Secondary | ICD-10-CM | POA: Diagnosis not present

## 2019-02-06 DIAGNOSIS — R293 Abnormal posture: Secondary | ICD-10-CM | POA: Diagnosis not present

## 2019-02-07 DIAGNOSIS — R2681 Unsteadiness on feet: Secondary | ICD-10-CM | POA: Diagnosis not present

## 2019-02-07 DIAGNOSIS — M6281 Muscle weakness (generalized): Secondary | ICD-10-CM | POA: Diagnosis not present

## 2019-02-07 DIAGNOSIS — R278 Other lack of coordination: Secondary | ICD-10-CM | POA: Diagnosis not present

## 2019-02-07 DIAGNOSIS — R293 Abnormal posture: Secondary | ICD-10-CM | POA: Diagnosis not present

## 2019-02-08 DIAGNOSIS — R2681 Unsteadiness on feet: Secondary | ICD-10-CM | POA: Diagnosis not present

## 2019-02-08 DIAGNOSIS — R293 Abnormal posture: Secondary | ICD-10-CM | POA: Diagnosis not present

## 2019-02-08 DIAGNOSIS — M6281 Muscle weakness (generalized): Secondary | ICD-10-CM | POA: Diagnosis not present

## 2019-02-08 DIAGNOSIS — R278 Other lack of coordination: Secondary | ICD-10-CM | POA: Diagnosis not present

## 2019-02-11 DIAGNOSIS — M6281 Muscle weakness (generalized): Secondary | ICD-10-CM | POA: Diagnosis not present

## 2019-02-11 DIAGNOSIS — R2681 Unsteadiness on feet: Secondary | ICD-10-CM | POA: Diagnosis not present

## 2019-02-12 DIAGNOSIS — M6281 Muscle weakness (generalized): Secondary | ICD-10-CM | POA: Diagnosis not present

## 2019-02-12 DIAGNOSIS — R2681 Unsteadiness on feet: Secondary | ICD-10-CM | POA: Diagnosis not present

## 2019-02-13 DIAGNOSIS — R293 Abnormal posture: Secondary | ICD-10-CM | POA: Diagnosis not present

## 2019-02-13 DIAGNOSIS — R278 Other lack of coordination: Secondary | ICD-10-CM | POA: Diagnosis not present

## 2019-02-13 DIAGNOSIS — M6281 Muscle weakness (generalized): Secondary | ICD-10-CM | POA: Diagnosis not present

## 2019-02-15 DIAGNOSIS — R2681 Unsteadiness on feet: Secondary | ICD-10-CM | POA: Diagnosis not present

## 2019-02-15 DIAGNOSIS — M6281 Muscle weakness (generalized): Secondary | ICD-10-CM | POA: Diagnosis not present

## 2019-02-18 DIAGNOSIS — R293 Abnormal posture: Secondary | ICD-10-CM | POA: Diagnosis not present

## 2019-02-18 DIAGNOSIS — R2681 Unsteadiness on feet: Secondary | ICD-10-CM | POA: Diagnosis not present

## 2019-02-18 DIAGNOSIS — R278 Other lack of coordination: Secondary | ICD-10-CM | POA: Diagnosis not present

## 2019-02-18 DIAGNOSIS — M6281 Muscle weakness (generalized): Secondary | ICD-10-CM | POA: Diagnosis not present

## 2019-02-19 DIAGNOSIS — M6281 Muscle weakness (generalized): Secondary | ICD-10-CM | POA: Diagnosis not present

## 2019-02-19 DIAGNOSIS — R2681 Unsteadiness on feet: Secondary | ICD-10-CM | POA: Diagnosis not present

## 2019-02-19 DIAGNOSIS — R293 Abnormal posture: Secondary | ICD-10-CM | POA: Diagnosis not present

## 2019-02-19 DIAGNOSIS — R278 Other lack of coordination: Secondary | ICD-10-CM | POA: Diagnosis not present

## 2019-02-20 DIAGNOSIS — M6281 Muscle weakness (generalized): Secondary | ICD-10-CM | POA: Diagnosis not present

## 2019-02-20 DIAGNOSIS — R293 Abnormal posture: Secondary | ICD-10-CM | POA: Diagnosis not present

## 2019-02-20 DIAGNOSIS — R278 Other lack of coordination: Secondary | ICD-10-CM | POA: Diagnosis not present

## 2019-02-22 DIAGNOSIS — R293 Abnormal posture: Secondary | ICD-10-CM | POA: Diagnosis not present

## 2019-02-22 DIAGNOSIS — M6281 Muscle weakness (generalized): Secondary | ICD-10-CM | POA: Diagnosis not present

## 2019-02-22 DIAGNOSIS — R278 Other lack of coordination: Secondary | ICD-10-CM | POA: Diagnosis not present

## 2019-02-25 DIAGNOSIS — R2681 Unsteadiness on feet: Secondary | ICD-10-CM | POA: Diagnosis not present

## 2019-02-25 DIAGNOSIS — M6281 Muscle weakness (generalized): Secondary | ICD-10-CM | POA: Diagnosis not present

## 2019-02-26 DIAGNOSIS — M6281 Muscle weakness (generalized): Secondary | ICD-10-CM | POA: Diagnosis not present

## 2019-02-26 DIAGNOSIS — R293 Abnormal posture: Secondary | ICD-10-CM | POA: Diagnosis not present

## 2019-02-26 DIAGNOSIS — R2681 Unsteadiness on feet: Secondary | ICD-10-CM | POA: Diagnosis not present

## 2019-02-26 DIAGNOSIS — R278 Other lack of coordination: Secondary | ICD-10-CM | POA: Diagnosis not present

## 2019-02-27 DIAGNOSIS — M6281 Muscle weakness (generalized): Secondary | ICD-10-CM | POA: Diagnosis not present

## 2019-02-27 DIAGNOSIS — R293 Abnormal posture: Secondary | ICD-10-CM | POA: Diagnosis not present

## 2019-02-27 DIAGNOSIS — R278 Other lack of coordination: Secondary | ICD-10-CM | POA: Diagnosis not present

## 2019-02-28 DIAGNOSIS — M6281 Muscle weakness (generalized): Secondary | ICD-10-CM | POA: Diagnosis not present

## 2019-02-28 DIAGNOSIS — R2681 Unsteadiness on feet: Secondary | ICD-10-CM | POA: Diagnosis not present

## 2019-03-01 DIAGNOSIS — R278 Other lack of coordination: Secondary | ICD-10-CM | POA: Diagnosis not present

## 2019-03-01 DIAGNOSIS — M6281 Muscle weakness (generalized): Secondary | ICD-10-CM | POA: Diagnosis not present

## 2019-03-01 DIAGNOSIS — R293 Abnormal posture: Secondary | ICD-10-CM | POA: Diagnosis not present

## 2019-03-04 DIAGNOSIS — G301 Alzheimer's disease with late onset: Secondary | ICD-10-CM | POA: Diagnosis not present

## 2019-03-04 DIAGNOSIS — F0281 Dementia in other diseases classified elsewhere with behavioral disturbance: Secondary | ICD-10-CM | POA: Diagnosis not present

## 2019-03-04 DIAGNOSIS — I10 Essential (primary) hypertension: Secondary | ICD-10-CM | POA: Diagnosis not present

## 2019-03-04 DIAGNOSIS — R05 Cough: Secondary | ICD-10-CM | POA: Diagnosis not present

## 2019-03-04 DIAGNOSIS — R2681 Unsteadiness on feet: Secondary | ICD-10-CM | POA: Diagnosis not present

## 2019-03-04 DIAGNOSIS — M6281 Muscle weakness (generalized): Secondary | ICD-10-CM | POA: Diagnosis not present

## 2019-03-05 DIAGNOSIS — B351 Tinea unguium: Secondary | ICD-10-CM | POA: Diagnosis not present

## 2019-03-05 DIAGNOSIS — M6281 Muscle weakness (generalized): Secondary | ICD-10-CM | POA: Diagnosis not present

## 2019-03-05 DIAGNOSIS — M79675 Pain in left toe(s): Secondary | ICD-10-CM | POA: Diagnosis not present

## 2019-03-05 DIAGNOSIS — R2681 Unsteadiness on feet: Secondary | ICD-10-CM | POA: Diagnosis not present

## 2019-03-05 DIAGNOSIS — R278 Other lack of coordination: Secondary | ICD-10-CM | POA: Diagnosis not present

## 2019-03-05 DIAGNOSIS — M79674 Pain in right toe(s): Secondary | ICD-10-CM | POA: Diagnosis not present

## 2019-03-05 DIAGNOSIS — R05 Cough: Secondary | ICD-10-CM | POA: Diagnosis not present

## 2019-03-05 DIAGNOSIS — R293 Abnormal posture: Secondary | ICD-10-CM | POA: Diagnosis not present

## 2019-03-06 ENCOUNTER — Other Ambulatory Visit: Payer: Self-pay | Admitting: Internal Medicine

## 2019-03-06 DIAGNOSIS — R278 Other lack of coordination: Secondary | ICD-10-CM | POA: Diagnosis not present

## 2019-03-06 DIAGNOSIS — M6281 Muscle weakness (generalized): Secondary | ICD-10-CM | POA: Diagnosis not present

## 2019-03-06 DIAGNOSIS — R293 Abnormal posture: Secondary | ICD-10-CM | POA: Diagnosis not present

## 2019-03-07 DIAGNOSIS — R2681 Unsteadiness on feet: Secondary | ICD-10-CM | POA: Diagnosis not present

## 2019-03-07 DIAGNOSIS — M6281 Muscle weakness (generalized): Secondary | ICD-10-CM | POA: Diagnosis not present

## 2019-03-08 DIAGNOSIS — R293 Abnormal posture: Secondary | ICD-10-CM | POA: Diagnosis not present

## 2019-03-08 DIAGNOSIS — R278 Other lack of coordination: Secondary | ICD-10-CM | POA: Diagnosis not present

## 2019-03-08 DIAGNOSIS — M6281 Muscle weakness (generalized): Secondary | ICD-10-CM | POA: Diagnosis not present

## 2019-03-11 DIAGNOSIS — M6281 Muscle weakness (generalized): Secondary | ICD-10-CM | POA: Diagnosis not present

## 2019-03-11 DIAGNOSIS — R2681 Unsteadiness on feet: Secondary | ICD-10-CM | POA: Diagnosis not present

## 2019-03-12 DIAGNOSIS — R2681 Unsteadiness on feet: Secondary | ICD-10-CM | POA: Diagnosis not present

## 2019-03-12 DIAGNOSIS — R293 Abnormal posture: Secondary | ICD-10-CM | POA: Diagnosis not present

## 2019-03-12 DIAGNOSIS — R278 Other lack of coordination: Secondary | ICD-10-CM | POA: Diagnosis not present

## 2019-03-12 DIAGNOSIS — M6281 Muscle weakness (generalized): Secondary | ICD-10-CM | POA: Diagnosis not present

## 2019-03-14 DIAGNOSIS — M6281 Muscle weakness (generalized): Secondary | ICD-10-CM | POA: Diagnosis not present

## 2019-03-14 DIAGNOSIS — R293 Abnormal posture: Secondary | ICD-10-CM | POA: Diagnosis not present

## 2019-03-14 DIAGNOSIS — R278 Other lack of coordination: Secondary | ICD-10-CM | POA: Diagnosis not present

## 2019-03-14 DIAGNOSIS — R2681 Unsteadiness on feet: Secondary | ICD-10-CM | POA: Diagnosis not present

## 2019-03-15 DIAGNOSIS — M6281 Muscle weakness (generalized): Secondary | ICD-10-CM | POA: Diagnosis not present

## 2019-03-15 DIAGNOSIS — R278 Other lack of coordination: Secondary | ICD-10-CM | POA: Diagnosis not present

## 2019-03-15 DIAGNOSIS — R293 Abnormal posture: Secondary | ICD-10-CM | POA: Diagnosis not present

## 2019-03-18 DIAGNOSIS — R278 Other lack of coordination: Secondary | ICD-10-CM | POA: Diagnosis not present

## 2019-03-18 DIAGNOSIS — M6281 Muscle weakness (generalized): Secondary | ICD-10-CM | POA: Diagnosis not present

## 2019-03-18 DIAGNOSIS — R2681 Unsteadiness on feet: Secondary | ICD-10-CM | POA: Diagnosis not present

## 2019-03-18 DIAGNOSIS — R293 Abnormal posture: Secondary | ICD-10-CM | POA: Diagnosis not present

## 2019-03-19 DIAGNOSIS — R419 Unspecified symptoms and signs involving cognitive functions and awareness: Secondary | ICD-10-CM | POA: Diagnosis not present

## 2019-03-19 DIAGNOSIS — R2681 Unsteadiness on feet: Secondary | ICD-10-CM | POA: Diagnosis not present

## 2019-03-19 DIAGNOSIS — M6281 Muscle weakness (generalized): Secondary | ICD-10-CM | POA: Diagnosis not present

## 2019-03-19 DIAGNOSIS — F419 Anxiety disorder, unspecified: Secondary | ICD-10-CM | POA: Diagnosis not present

## 2019-03-21 DIAGNOSIS — R278 Other lack of coordination: Secondary | ICD-10-CM | POA: Diagnosis not present

## 2019-03-21 DIAGNOSIS — R293 Abnormal posture: Secondary | ICD-10-CM | POA: Diagnosis not present

## 2019-03-21 DIAGNOSIS — R2681 Unsteadiness on feet: Secondary | ICD-10-CM | POA: Diagnosis not present

## 2019-03-21 DIAGNOSIS — M6281 Muscle weakness (generalized): Secondary | ICD-10-CM | POA: Diagnosis not present

## 2019-03-22 DIAGNOSIS — R278 Other lack of coordination: Secondary | ICD-10-CM | POA: Diagnosis not present

## 2019-03-22 DIAGNOSIS — M6281 Muscle weakness (generalized): Secondary | ICD-10-CM | POA: Diagnosis not present

## 2019-03-22 DIAGNOSIS — R293 Abnormal posture: Secondary | ICD-10-CM | POA: Diagnosis not present

## 2019-03-25 DIAGNOSIS — M6281 Muscle weakness (generalized): Secondary | ICD-10-CM | POA: Diagnosis not present

## 2019-03-25 DIAGNOSIS — R2681 Unsteadiness on feet: Secondary | ICD-10-CM | POA: Diagnosis not present

## 2019-03-26 DIAGNOSIS — M6281 Muscle weakness (generalized): Secondary | ICD-10-CM | POA: Diagnosis not present

## 2019-03-26 DIAGNOSIS — R293 Abnormal posture: Secondary | ICD-10-CM | POA: Diagnosis not present

## 2019-03-26 DIAGNOSIS — R278 Other lack of coordination: Secondary | ICD-10-CM | POA: Diagnosis not present

## 2019-03-27 DIAGNOSIS — R2681 Unsteadiness on feet: Secondary | ICD-10-CM | POA: Diagnosis not present

## 2019-03-27 DIAGNOSIS — R278 Other lack of coordination: Secondary | ICD-10-CM | POA: Diagnosis not present

## 2019-03-27 DIAGNOSIS — R293 Abnormal posture: Secondary | ICD-10-CM | POA: Diagnosis not present

## 2019-03-27 DIAGNOSIS — M6281 Muscle weakness (generalized): Secondary | ICD-10-CM | POA: Diagnosis not present

## 2019-03-28 DIAGNOSIS — R2681 Unsteadiness on feet: Secondary | ICD-10-CM | POA: Diagnosis not present

## 2019-03-28 DIAGNOSIS — M6281 Muscle weakness (generalized): Secondary | ICD-10-CM | POA: Diagnosis not present

## 2019-03-29 DIAGNOSIS — M6281 Muscle weakness (generalized): Secondary | ICD-10-CM | POA: Diagnosis not present

## 2019-03-29 DIAGNOSIS — R293 Abnormal posture: Secondary | ICD-10-CM | POA: Diagnosis not present

## 2019-03-29 DIAGNOSIS — R278 Other lack of coordination: Secondary | ICD-10-CM | POA: Diagnosis not present

## 2019-04-01 DIAGNOSIS — R2681 Unsteadiness on feet: Secondary | ICD-10-CM | POA: Diagnosis not present

## 2019-04-01 DIAGNOSIS — M6281 Muscle weakness (generalized): Secondary | ICD-10-CM | POA: Diagnosis not present

## 2019-04-02 DIAGNOSIS — R2681 Unsteadiness on feet: Secondary | ICD-10-CM | POA: Diagnosis not present

## 2019-04-02 DIAGNOSIS — M6281 Muscle weakness (generalized): Secondary | ICD-10-CM | POA: Diagnosis not present

## 2019-04-03 DIAGNOSIS — R293 Abnormal posture: Secondary | ICD-10-CM | POA: Diagnosis not present

## 2019-04-03 DIAGNOSIS — R2681 Unsteadiness on feet: Secondary | ICD-10-CM | POA: Diagnosis not present

## 2019-04-03 DIAGNOSIS — R278 Other lack of coordination: Secondary | ICD-10-CM | POA: Diagnosis not present

## 2019-04-03 DIAGNOSIS — M6281 Muscle weakness (generalized): Secondary | ICD-10-CM | POA: Diagnosis not present

## 2019-04-04 DIAGNOSIS — R293 Abnormal posture: Secondary | ICD-10-CM | POA: Diagnosis not present

## 2019-04-04 DIAGNOSIS — M6281 Muscle weakness (generalized): Secondary | ICD-10-CM | POA: Diagnosis not present

## 2019-04-04 DIAGNOSIS — R278 Other lack of coordination: Secondary | ICD-10-CM | POA: Diagnosis not present

## 2019-04-05 DIAGNOSIS — R293 Abnormal posture: Secondary | ICD-10-CM | POA: Diagnosis not present

## 2019-04-05 DIAGNOSIS — M6281 Muscle weakness (generalized): Secondary | ICD-10-CM | POA: Diagnosis not present

## 2019-04-05 DIAGNOSIS — R278 Other lack of coordination: Secondary | ICD-10-CM | POA: Diagnosis not present

## 2019-04-08 DIAGNOSIS — M6281 Muscle weakness (generalized): Secondary | ICD-10-CM | POA: Diagnosis not present

## 2019-04-08 DIAGNOSIS — R2681 Unsteadiness on feet: Secondary | ICD-10-CM | POA: Diagnosis not present

## 2019-04-09 DIAGNOSIS — R293 Abnormal posture: Secondary | ICD-10-CM | POA: Diagnosis not present

## 2019-04-09 DIAGNOSIS — R278 Other lack of coordination: Secondary | ICD-10-CM | POA: Diagnosis not present

## 2019-04-09 DIAGNOSIS — M6281 Muscle weakness (generalized): Secondary | ICD-10-CM | POA: Diagnosis not present

## 2019-04-09 DIAGNOSIS — R2681 Unsteadiness on feet: Secondary | ICD-10-CM | POA: Diagnosis not present

## 2019-04-11 DIAGNOSIS — R278 Other lack of coordination: Secondary | ICD-10-CM | POA: Diagnosis not present

## 2019-04-11 DIAGNOSIS — R293 Abnormal posture: Secondary | ICD-10-CM | POA: Diagnosis not present

## 2019-04-11 DIAGNOSIS — M6281 Muscle weakness (generalized): Secondary | ICD-10-CM | POA: Diagnosis not present

## 2019-04-11 DIAGNOSIS — R2681 Unsteadiness on feet: Secondary | ICD-10-CM | POA: Diagnosis not present

## 2019-04-15 DIAGNOSIS — M6281 Muscle weakness (generalized): Secondary | ICD-10-CM | POA: Diagnosis not present

## 2019-04-15 DIAGNOSIS — R2681 Unsteadiness on feet: Secondary | ICD-10-CM | POA: Diagnosis not present

## 2019-04-15 DIAGNOSIS — R293 Abnormal posture: Secondary | ICD-10-CM | POA: Diagnosis not present

## 2019-04-15 DIAGNOSIS — R278 Other lack of coordination: Secondary | ICD-10-CM | POA: Diagnosis not present

## 2019-04-16 DIAGNOSIS — M6281 Muscle weakness (generalized): Secondary | ICD-10-CM | POA: Diagnosis not present

## 2019-04-16 DIAGNOSIS — R293 Abnormal posture: Secondary | ICD-10-CM | POA: Diagnosis not present

## 2019-04-16 DIAGNOSIS — R2681 Unsteadiness on feet: Secondary | ICD-10-CM | POA: Diagnosis not present

## 2019-04-16 DIAGNOSIS — R278 Other lack of coordination: Secondary | ICD-10-CM | POA: Diagnosis not present

## 2019-04-17 DIAGNOSIS — M6281 Muscle weakness (generalized): Secondary | ICD-10-CM | POA: Diagnosis not present

## 2019-04-17 DIAGNOSIS — R2681 Unsteadiness on feet: Secondary | ICD-10-CM | POA: Diagnosis not present

## 2019-04-18 DIAGNOSIS — R278 Other lack of coordination: Secondary | ICD-10-CM | POA: Diagnosis not present

## 2019-04-18 DIAGNOSIS — M6281 Muscle weakness (generalized): Secondary | ICD-10-CM | POA: Diagnosis not present

## 2019-04-18 DIAGNOSIS — R293 Abnormal posture: Secondary | ICD-10-CM | POA: Diagnosis not present

## 2019-04-22 DIAGNOSIS — R2681 Unsteadiness on feet: Secondary | ICD-10-CM | POA: Diagnosis not present

## 2019-04-22 DIAGNOSIS — M6281 Muscle weakness (generalized): Secondary | ICD-10-CM | POA: Diagnosis not present

## 2019-04-23 DIAGNOSIS — R278 Other lack of coordination: Secondary | ICD-10-CM | POA: Diagnosis not present

## 2019-04-23 DIAGNOSIS — R2681 Unsteadiness on feet: Secondary | ICD-10-CM | POA: Diagnosis not present

## 2019-04-23 DIAGNOSIS — M6281 Muscle weakness (generalized): Secondary | ICD-10-CM | POA: Diagnosis not present

## 2019-04-23 DIAGNOSIS — R293 Abnormal posture: Secondary | ICD-10-CM | POA: Diagnosis not present

## 2019-04-24 DIAGNOSIS — R293 Abnormal posture: Secondary | ICD-10-CM | POA: Diagnosis not present

## 2019-04-24 DIAGNOSIS — R278 Other lack of coordination: Secondary | ICD-10-CM | POA: Diagnosis not present

## 2019-04-24 DIAGNOSIS — M6281 Muscle weakness (generalized): Secondary | ICD-10-CM | POA: Diagnosis not present

## 2019-04-25 DIAGNOSIS — R278 Other lack of coordination: Secondary | ICD-10-CM | POA: Diagnosis not present

## 2019-04-25 DIAGNOSIS — M6281 Muscle weakness (generalized): Secondary | ICD-10-CM | POA: Diagnosis not present

## 2019-04-25 DIAGNOSIS — R2681 Unsteadiness on feet: Secondary | ICD-10-CM | POA: Diagnosis not present

## 2019-04-25 DIAGNOSIS — R293 Abnormal posture: Secondary | ICD-10-CM | POA: Diagnosis not present

## 2019-04-29 DIAGNOSIS — R2681 Unsteadiness on feet: Secondary | ICD-10-CM | POA: Diagnosis not present

## 2019-04-29 DIAGNOSIS — M6281 Muscle weakness (generalized): Secondary | ICD-10-CM | POA: Diagnosis not present

## 2019-04-30 DIAGNOSIS — R2681 Unsteadiness on feet: Secondary | ICD-10-CM | POA: Diagnosis not present

## 2019-04-30 DIAGNOSIS — R278 Other lack of coordination: Secondary | ICD-10-CM | POA: Diagnosis not present

## 2019-04-30 DIAGNOSIS — M6281 Muscle weakness (generalized): Secondary | ICD-10-CM | POA: Diagnosis not present

## 2019-04-30 DIAGNOSIS — R293 Abnormal posture: Secondary | ICD-10-CM | POA: Diagnosis not present

## 2019-05-01 DIAGNOSIS — R293 Abnormal posture: Secondary | ICD-10-CM | POA: Diagnosis not present

## 2019-05-01 DIAGNOSIS — M6281 Muscle weakness (generalized): Secondary | ICD-10-CM | POA: Diagnosis not present

## 2019-05-01 DIAGNOSIS — R278 Other lack of coordination: Secondary | ICD-10-CM | POA: Diagnosis not present

## 2019-05-07 DIAGNOSIS — F419 Anxiety disorder, unspecified: Secondary | ICD-10-CM | POA: Diagnosis not present

## 2019-05-07 DIAGNOSIS — R419 Unspecified symptoms and signs involving cognitive functions and awareness: Secondary | ICD-10-CM | POA: Diagnosis not present

## 2019-05-11 DIAGNOSIS — Z23 Encounter for immunization: Secondary | ICD-10-CM | POA: Diagnosis not present

## 2019-05-15 DIAGNOSIS — M79674 Pain in right toe(s): Secondary | ICD-10-CM | POA: Diagnosis not present

## 2019-05-15 DIAGNOSIS — M79675 Pain in left toe(s): Secondary | ICD-10-CM | POA: Diagnosis not present

## 2019-05-15 DIAGNOSIS — B351 Tinea unguium: Secondary | ICD-10-CM | POA: Diagnosis not present

## 2019-05-22 ENCOUNTER — Emergency Department (HOSPITAL_COMMUNITY): Payer: Medicare Other

## 2019-05-22 ENCOUNTER — Emergency Department (HOSPITAL_COMMUNITY)
Admission: EM | Admit: 2019-05-22 | Discharge: 2019-05-22 | Disposition: A | Discharge status: Skilled Nursing Facility | Payer: Medicare Other | Attending: Emergency Medicine | Admitting: Emergency Medicine

## 2019-05-22 ENCOUNTER — Other Ambulatory Visit: Payer: Self-pay

## 2019-05-22 ENCOUNTER — Encounter (HOSPITAL_COMMUNITY): Payer: Self-pay | Admitting: Obstetrics and Gynecology

## 2019-05-22 DIAGNOSIS — I270 Primary pulmonary hypertension: Secondary | ICD-10-CM | POA: Diagnosis not present

## 2019-05-22 DIAGNOSIS — R911 Solitary pulmonary nodule: Secondary | ICD-10-CM | POA: Diagnosis not present

## 2019-05-22 DIAGNOSIS — F0390 Unspecified dementia without behavioral disturbance: Secondary | ICD-10-CM | POA: Insufficient documentation

## 2019-05-22 DIAGNOSIS — I2721 Secondary pulmonary arterial hypertension: Secondary | ICD-10-CM

## 2019-05-22 DIAGNOSIS — R410 Disorientation, unspecified: Secondary | ICD-10-CM | POA: Diagnosis not present

## 2019-05-22 DIAGNOSIS — M2550 Pain in unspecified joint: Secondary | ICD-10-CM | POA: Diagnosis not present

## 2019-05-22 DIAGNOSIS — W19XXXA Unspecified fall, initial encounter: Secondary | ICD-10-CM | POA: Insufficient documentation

## 2019-05-22 DIAGNOSIS — I700 Atherosclerosis of aorta: Secondary | ICD-10-CM | POA: Insufficient documentation

## 2019-05-22 DIAGNOSIS — S3992XA Unspecified injury of lower back, initial encounter: Secondary | ICD-10-CM | POA: Diagnosis not present

## 2019-05-22 DIAGNOSIS — I10 Essential (primary) hypertension: Secondary | ICD-10-CM | POA: Insufficient documentation

## 2019-05-22 DIAGNOSIS — S299XXA Unspecified injury of thorax, initial encounter: Secondary | ICD-10-CM | POA: Diagnosis not present

## 2019-05-22 DIAGNOSIS — I517 Cardiomegaly: Secondary | ICD-10-CM | POA: Diagnosis not present

## 2019-05-22 DIAGNOSIS — R4182 Altered mental status, unspecified: Secondary | ICD-10-CM | POA: Diagnosis not present

## 2019-05-22 DIAGNOSIS — S3993XA Unspecified injury of pelvis, initial encounter: Secondary | ICD-10-CM | POA: Diagnosis not present

## 2019-05-22 DIAGNOSIS — Z7401 Bed confinement status: Secondary | ICD-10-CM | POA: Diagnosis not present

## 2019-05-22 DIAGNOSIS — R918 Other nonspecific abnormal finding of lung field: Secondary | ICD-10-CM | POA: Diagnosis not present

## 2019-05-22 DIAGNOSIS — Z043 Encounter for examination and observation following other accident: Secondary | ICD-10-CM | POA: Diagnosis present

## 2019-05-22 DIAGNOSIS — R5381 Other malaise: Secondary | ICD-10-CM | POA: Diagnosis not present

## 2019-05-22 DIAGNOSIS — R52 Pain, unspecified: Secondary | ICD-10-CM | POA: Diagnosis not present

## 2019-05-22 LAB — I-STAT CHEM 8, ED
BUN: 27 mg/dL — ABNORMAL HIGH (ref 8–23)
Calcium, Ion: 1.2 mmol/L (ref 1.15–1.40)
Chloride: 104 mmol/L (ref 98–111)
Creatinine, Ser: 1 mg/dL (ref 0.44–1.00)
Glucose, Bld: 72 mg/dL (ref 70–99)
HCT: 31 % — ABNORMAL LOW (ref 36.0–46.0)
Hemoglobin: 10.5 g/dL — ABNORMAL LOW (ref 12.0–15.0)
Potassium: 3.8 mmol/L (ref 3.5–5.1)
Sodium: 142 mmol/L (ref 135–145)
TCO2: 28 mmol/L (ref 22–32)

## 2019-05-22 MED ORDER — IOHEXOL 300 MG/ML  SOLN
75.0000 mL | Freq: Once | INTRAMUSCULAR | Status: AC | PRN
  Administered 2019-05-22: 75 mL via INTRAVENOUS
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
## 2019-05-22 NOTE — ED Notes (Signed)
Pt keeps taking off C-collar despite multiple attempts at redirecting pt. Pt is only A&Ox1, which is pts baseline according to staff and isn't able to follow directions well.
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
## 2019-05-22 NOTE — ED Notes (Signed)
Patient ambulated to bathroom with assistance, changed out of wet brief and cleaned. Patient put into dry brief and gown and bed changed.

## 2019-05-22 NOTE — Discharge Instructions (Addendum)
Patient had a CT scan of the chest which showed several findings that will need to be follow up on by her primary care doctor.   These findings include:  1. Dilated main pulmonary artery, suggesting pulmonary arterial hypertension. The right suprahilar prominence on chest radiograph correlates with tortuous ascending thoracic aorta and prominent right pulmonary artery. No thoracic adenopathy or lung masses.  2. Mild cardiomegaly.  3. Biapical bronchiectasis, right greater than left.  4. Right middle lobe 4 mm solid pulmonary nodule. No follow-up needed if patient is low-risk. Non-contrast chest CT can be considered in 12 months if patient is high-risk. This recommendation follows the consensus statement: Guidelines for Management of Incidental Pulmonary Nodules Detected on CT Images:From the Fleischner Society 2017; published online before print (10.1148/[id number]).  5. Low-attenuation 1.9 cm nodular structure in the left upper retroperitoneum adjacent to the adrenal gland, incompletely visualized, potentially a left adrenal nodule. Follow-up adrenal protocol CT abdomen may be obtained in 12 months as clinically warranted.  6. Healing anterior left third through fifth and lower sternal fractures, probably late subacute. Correlate with injury history.  7. Aortic Atherosclerosis   The rest of her workup was reassuring and did not show any acute injuries.   Please have her follow up with her regular doctor in the next 5-7 days and have her bring this paperwork to the visit.   Please send the patient back to the emergency room for any new or worsening symptoms.

## 2019-05-26 ENCOUNTER — Other Ambulatory Visit: Payer: Self-pay | Admitting: Family

## 2019-05-27 DIAGNOSIS — F0281 Dementia in other diseases classified elsewhere with behavioral disturbance: Secondary | ICD-10-CM | POA: Diagnosis not present

## 2019-05-27 DIAGNOSIS — G301 Alzheimer's disease with late onset: Secondary | ICD-10-CM | POA: Diagnosis not present

## 2019-05-27 DIAGNOSIS — I700 Atherosclerosis of aorta: Secondary | ICD-10-CM | POA: Diagnosis not present

## 2019-05-27 DIAGNOSIS — R918 Other nonspecific abnormal finding of lung field: Secondary | ICD-10-CM | POA: Diagnosis not present

## 2019-05-27 DIAGNOSIS — I270 Primary pulmonary hypertension: Secondary | ICD-10-CM | POA: Diagnosis not present

## 2019-06-18 DIAGNOSIS — R419 Unspecified symptoms and signs involving cognitive functions and awareness: Secondary | ICD-10-CM | POA: Diagnosis not present

## 2019-06-18 DIAGNOSIS — F419 Anxiety disorder, unspecified: Secondary | ICD-10-CM | POA: Diagnosis not present

## 2019-07-17 DIAGNOSIS — B351 Tinea unguium: Secondary | ICD-10-CM | POA: Diagnosis not present

## 2019-07-17 DIAGNOSIS — M79674 Pain in right toe(s): Secondary | ICD-10-CM | POA: Diagnosis not present

## 2019-07-17 DIAGNOSIS — M79675 Pain in left toe(s): Secondary | ICD-10-CM | POA: Diagnosis not present

## 2019-07-24 DIAGNOSIS — F419 Anxiety disorder, unspecified: Secondary | ICD-10-CM | POA: Diagnosis not present

## 2019-07-24 DIAGNOSIS — R419 Unspecified symptoms and signs involving cognitive functions and awareness: Secondary | ICD-10-CM | POA: Diagnosis not present

## 2019-08-20 DIAGNOSIS — R419 Unspecified symptoms and signs involving cognitive functions and awareness: Secondary | ICD-10-CM | POA: Diagnosis not present

## 2019-08-20 DIAGNOSIS — F419 Anxiety disorder, unspecified: Secondary | ICD-10-CM | POA: Diagnosis not present

## 2019-08-26 ENCOUNTER — Emergency Department (HOSPITAL_COMMUNITY): Payer: Medicare Other

## 2019-08-26 ENCOUNTER — Encounter (HOSPITAL_COMMUNITY): Payer: Self-pay | Admitting: *Deleted

## 2019-08-26 ENCOUNTER — Inpatient Hospital Stay (HOSPITAL_COMMUNITY)
Admission: EM | Admit: 2019-08-26 | Discharge: 2019-09-04 | DRG: 481 | Disposition: A | Discharge status: Skilled Nursing Facility | Payer: Medicare Other | Source: Skilled Nursing Facility | Attending: Orthopedic Surgery | Admitting: Orthopedic Surgery

## 2019-08-26 ENCOUNTER — Other Ambulatory Visit: Payer: Self-pay

## 2019-08-26 DIAGNOSIS — S72142A Displaced intertrochanteric fracture of left femur, initial encounter for closed fracture: Secondary | ICD-10-CM | POA: Diagnosis present

## 2019-08-26 DIAGNOSIS — Z7401 Bed confinement status: Secondary | ICD-10-CM | POA: Diagnosis not present

## 2019-08-26 DIAGNOSIS — S72002A Fracture of unspecified part of neck of left femur, initial encounter for closed fracture: Secondary | ICD-10-CM | POA: Diagnosis present

## 2019-08-26 DIAGNOSIS — D62 Acute posthemorrhagic anemia: Secondary | ICD-10-CM | POA: Diagnosis not present

## 2019-08-26 DIAGNOSIS — R2681 Unsteadiness on feet: Secondary | ICD-10-CM | POA: Diagnosis not present

## 2019-08-26 DIAGNOSIS — Z8249 Family history of ischemic heart disease and other diseases of the circulatory system: Secondary | ICD-10-CM

## 2019-08-26 DIAGNOSIS — J984 Other disorders of lung: Secondary | ICD-10-CM | POA: Diagnosis not present

## 2019-08-26 DIAGNOSIS — Z833 Family history of diabetes mellitus: Secondary | ICD-10-CM | POA: Diagnosis not present

## 2019-08-26 DIAGNOSIS — U071 COVID-19: Secondary | ICD-10-CM | POA: Diagnosis not present

## 2019-08-26 DIAGNOSIS — Z8261 Family history of arthritis: Secondary | ICD-10-CM

## 2019-08-26 DIAGNOSIS — R52 Pain, unspecified: Secondary | ICD-10-CM | POA: Diagnosis not present

## 2019-08-26 DIAGNOSIS — Z20822 Contact with and (suspected) exposure to covid-19: Secondary | ICD-10-CM | POA: Diagnosis present

## 2019-08-26 DIAGNOSIS — H409 Unspecified glaucoma: Secondary | ICD-10-CM | POA: Diagnosis present

## 2019-08-26 DIAGNOSIS — I517 Cardiomegaly: Secondary | ICD-10-CM | POA: Diagnosis not present

## 2019-08-26 DIAGNOSIS — Z809 Family history of malignant neoplasm, unspecified: Secondary | ICD-10-CM | POA: Diagnosis not present

## 2019-08-26 DIAGNOSIS — W010XXA Fall on same level from slipping, tripping and stumbling without subsequent striking against object, initial encounter: Secondary | ICD-10-CM | POA: Diagnosis present

## 2019-08-26 DIAGNOSIS — S0990XA Unspecified injury of head, initial encounter: Secondary | ICD-10-CM | POA: Diagnosis not present

## 2019-08-26 DIAGNOSIS — W1841XA Slipping, tripping and stumbling without falling due to stepping on object, initial encounter: Secondary | ICD-10-CM | POA: Diagnosis not present

## 2019-08-26 DIAGNOSIS — M2550 Pain in unspecified joint: Secondary | ICD-10-CM | POA: Diagnosis not present

## 2019-08-26 DIAGNOSIS — R262 Difficulty in walking, not elsewhere classified: Secondary | ICD-10-CM | POA: Diagnosis not present

## 2019-08-26 DIAGNOSIS — Y92129 Unspecified place in nursing home as the place of occurrence of the external cause: Secondary | ICD-10-CM | POA: Diagnosis not present

## 2019-08-26 DIAGNOSIS — S72142D Displaced intertrochanteric fracture of left femur, subsequent encounter for closed fracture with routine healing: Secondary | ICD-10-CM | POA: Diagnosis not present

## 2019-08-26 DIAGNOSIS — W19XXXA Unspecified fall, initial encounter: Secondary | ICD-10-CM | POA: Diagnosis not present

## 2019-08-26 DIAGNOSIS — Z9181 History of falling: Secondary | ICD-10-CM | POA: Diagnosis not present

## 2019-08-26 DIAGNOSIS — I10 Essential (primary) hypertension: Secondary | ICD-10-CM | POA: Diagnosis present

## 2019-08-26 DIAGNOSIS — Z4789 Encounter for other orthopedic aftercare: Secondary | ICD-10-CM | POA: Diagnosis not present

## 2019-08-26 DIAGNOSIS — Z01818 Encounter for other preprocedural examination: Secondary | ICD-10-CM | POA: Diagnosis not present

## 2019-08-26 DIAGNOSIS — R5381 Other malaise: Secondary | ICD-10-CM | POA: Diagnosis not present

## 2019-08-26 DIAGNOSIS — R519 Headache, unspecified: Secondary | ICD-10-CM | POA: Diagnosis not present

## 2019-08-26 DIAGNOSIS — F0390 Unspecified dementia without behavioral disturbance: Secondary | ICD-10-CM | POA: Diagnosis present

## 2019-08-26 DIAGNOSIS — D649 Anemia, unspecified: Secondary | ICD-10-CM | POA: Diagnosis not present

## 2019-08-26 DIAGNOSIS — Z03818 Encounter for observation for suspected exposure to other biological agents ruled out: Secondary | ICD-10-CM | POA: Diagnosis not present

## 2019-08-26 DIAGNOSIS — M6281 Muscle weakness (generalized): Secondary | ICD-10-CM | POA: Diagnosis not present

## 2019-08-26 DIAGNOSIS — Z419 Encounter for procedure for purposes other than remedying health state, unspecified: Secondary | ICD-10-CM | POA: Diagnosis not present

## 2019-08-26 LAB — TYPE AND SCREEN
ABO/RH(D): O POS
Antibody Screen: NEGATIVE

## 2019-08-26 LAB — COMPREHENSIVE METABOLIC PANEL
ALT: 30 U/L (ref 0–44)
AST: 34 U/L (ref 15–41)
Albumin: 3.6 g/dL (ref 3.5–5.0)
Alkaline Phosphatase: 72 U/L (ref 38–126)
Anion gap: 8 (ref 5–15)
BUN: 17 mg/dL (ref 8–23)
CO2: 28 mmol/L (ref 22–32)
Calcium: 8.8 mg/dL — ABNORMAL LOW (ref 8.9–10.3)
Chloride: 101 mmol/L (ref 98–111)
Creatinine, Ser: 0.75 mg/dL (ref 0.44–1.00)
GFR calc Af Amer: >60 mL/min (ref >60)
GFR calc non Af Amer: >60 mL/min (ref >60)
Glucose, Bld: 182 mg/dL — ABNORMAL HIGH (ref 70–99)
Potassium: 3.8 mmol/L (ref 3.5–5.1)
Sodium: 137 mmol/L (ref 135–145)
Total Bilirubin: 1.1 mg/dL (ref 0.3–1.2)
Total Protein: 7 g/dL (ref 6.5–8.1)

## 2019-08-26 LAB — CBC
HCT: 32.1 % — ABNORMAL LOW (ref 36.0–46.0)
Hemoglobin: 10.6 g/dL — ABNORMAL LOW (ref 12.0–15.0)
MCH: 35.7 pg — ABNORMAL HIGH (ref 26.0–34.0)
MCHC: 33 g/dL (ref 30.0–36.0)
MCV: 108.1 fL — ABNORMAL HIGH (ref 80.0–100.0)
Platelets: 203 10*3/uL (ref 150–400)
RBC: 2.97 MIL/uL — ABNORMAL LOW (ref 3.87–5.11)
RDW: 13.5 % (ref 11.5–15.5)
WBC: 12 10*3/uL — ABNORMAL HIGH (ref 4.0–10.5)
nRBC: 0 % (ref 0.0–0.2)

## 2019-08-26 LAB — RESPIRATORY PANEL BY RT PCR (FLU A&B, COVID)
Influenza A by PCR: NEGATIVE
Influenza B by PCR: NEGATIVE
SARS Coronavirus 2 by RT PCR: NEGATIVE

## 2019-08-26 LAB — URINALYSIS, ROUTINE W REFLEX MICROSCOPIC
Bilirubin Urine: NEGATIVE
Glucose, UA: NEGATIVE mg/dL
Hgb urine dipstick: NEGATIVE
Ketones, ur: NEGATIVE mg/dL
Leukocytes,Ua: NEGATIVE
Nitrite: NEGATIVE
Protein, ur: NEGATIVE mg/dL
Specific Gravity, Urine: 1.012 (ref 1.005–1.030)
pH: 6 (ref 5.0–8.0)

## 2019-08-26 LAB — ABO/RH: ABO/RH(D): O POS

## 2019-08-26 MED ORDER — TRAMADOL HCL 50 MG PO TABS: 50.0000 mg | ORAL_TABLET | Freq: Four times a day (QID) | ORAL | Status: DC | PRN

## 2019-08-26 MED ORDER — DOCUSATE SODIUM 100 MG PO CAPS: 100.0000 mg | ORAL_CAPSULE | Freq: Two times a day (BID) | ORAL | Status: DC

## 2019-08-26 MED ORDER — MORPHINE SULFATE (PF) 2 MG/ML IV SOLN: 0.5000 mg | INTRAVENOUS | Status: DC | PRN

## 2019-08-26 MED ORDER — MORPHINE SULFATE (PF) 2 MG/ML IV SOLN
2.0000 mg | Freq: Once | INTRAVENOUS | Status: AC
  Administered 2019-08-26: 2 mg via INTRAVENOUS
  Filled 2019-08-26: qty 1

## 2019-08-26 MED ORDER — ONDANSETRON HCL 4 MG/2ML IJ SOLN: 4.0000 mg | Freq: Four times a day (QID) | INTRAMUSCULAR | Status: DC | PRN

## 2019-08-26 MED ORDER — ACETAMINOPHEN 325 MG PO TABS: 325.0000 mg | ORAL_TABLET | Freq: Four times a day (QID) | ORAL | Status: DC | PRN

## 2019-08-26 MED ORDER — DIPHENHYDRAMINE HCL 12.5 MG/5ML PO ELIX: 12.5000 mg | ORAL_SOLUTION | ORAL | Status: DC | PRN

## 2019-08-26 MED ORDER — POLYETHYLENE GLYCOL 3350 17 G PO PACK: 17.0000 g | PACK | Freq: Every day | ORAL | Status: DC | PRN

## 2019-08-26 MED ORDER — ONDANSETRON HCL 4 MG PO TABS: 4.0000 mg | ORAL_TABLET | Freq: Four times a day (QID) | ORAL | Status: DC | PRN

## 2019-08-26 MED ORDER — METHOCARBAMOL 1000 MG/10ML IJ SOLN
500.0000 mg | Freq: Four times a day (QID) | INTRAVENOUS | Status: DC | PRN
  Filled 2019-08-26: qty 5

## 2019-08-26 MED ORDER — SODIUM CHLORIDE 0.9 % IV SOLN: INTRAVENOUS | Status: AC

## 2019-08-26 MED ORDER — ONDANSETRON HCL 4 MG/2ML IJ SOLN
4.0000 mg | Freq: Once | INTRAMUSCULAR | Status: AC
  Administered 2019-08-26: 4 mg via INTRAVENOUS
  Filled 2019-08-26: qty 2

## 2019-08-26 MED ORDER — METHOCARBAMOL 500 MG PO TABS
500.0000 mg | ORAL_TABLET | Freq: Four times a day (QID) | ORAL | Status: DC | PRN
  Administered 2019-08-28 (×2): 500 mg via ORAL
  Filled 2019-08-26 (×2): qty 1
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
## 2019-08-26 NOTE — ED Notes (Addendum)
Due to high census and holding in the ED, pt placed in hospital bed for comfort. Bed in locked and lowest position.

## 2019-08-26 NOTE — ED Notes (Signed)
Patient transported to X-ray 

## 2019-08-26 NOTE — ED Provider Notes (Addendum)
[facility] DEPT Provider Note   CSN: [id number] Arrival date & time: 08/26/19  1832     History Chief Complaint  Patient presents with  . Fall    Kayliana Christians is a 86 y.o. female.  Patient from [facility] via EMS s/p fall. Patient was noted to trip over walker, falling back and left. C/o left hip pain post fall. Did hit head, no loc. No vomiting post fall. Post fall, patients mental status reported as c/w baseline. Patient limited historian - dementia - level 5 caveat. No anticoag use. Skin intact.  Denies headache. Denies neck/back pain.   The history is provided by the patient, the EMS personnel and the nursing home. The history is limited by the condition of the patient.  Fall Pertinent negatives include no headaches.       Past Medical History:  Diagnosis Date  . Glaucoma   . Hypertension     Patient Active Problem List   Diagnosis Date Noted  . Routine general medical examination at a health care facility 10/11/2018  . Essential hypertension 10/10/2017  . Dementia ([place]) 10/10/2017    History reviewed. No pertinent surgical history.   OB History   No obstetric history on file.     Family History  Problem Relation Age of Onset  . Arthritis Daughter   . Hypertension Daughter   . Miscarriages / [place] Daughter   . Diabetes Son   . Hypertension Son   . Cancer Sister   . Early death Sister   . Heart disease Brother     Social History   Tobacco Use  . Smoking status: Never Smoker  . Smokeless tobacco: Never Used  Substance Use Topics  . Alcohol use: No  . Drug use: No    Home Medications Prior to Admission medications   Medication Sig Start Date End Date Taking? Authorizing Provider  acetaminophen (TYLENOL) 500 MG tablet Take 500 mg by mouth every 4 (four) hours as needed for mild pain, fever or headache.    [provider]  alum & mag hydroxide-simeth (MAALOX PLUS) 400-400-40 MG/5ML suspension Take 15 mLs by  mouth every 6 (six) hours as needed for indigestion.    [provider]  amLODipine (NORVASC) 5 MG tablet TAKE 2 TABLETS BY MOUTH EVERY DAY 05/27/19   Hoyt Koch, MD  divalproex (DEPAKOTE SPRINKLE) 125 MG capsule Take 125 mg by mouth daily. 02/11/19   [provider]  guaiFENesin (ROBITUSSIN) 100 MG/5ML SOLN Take 10 mLs by mouth every 4 (four) hours as needed for cough or to loosen phlegm.    [provider]  loperamide (IMODIUM) 2 MG capsule Take 2 mg by mouth as needed for diarrhea or loose stools.    [provider]  magnesium hydroxide (MILK OF MAGNESIA) 400 MG/5ML suspension Take 30 mLs by mouth at bedtime as needed for mild constipation.    [provider]  neomycin-bacitracin-polymyxin (NEOSPORIN) ointment Apply 1 application topically as needed for wound care.    [provider]    Allergies    Patient has no known allergies.  Review of Systems   Review of Systems  Unable to perform ROS: Dementia  Constitutional: Negative for fever.  Skin: Negative for wound.  Neurological: Negative for headaches.  level 5 caveat - dementia     Physical Exam Updated Vital Signs There were no vitals taken for this visit.  Physical Exam Vitals and nursing note reviewed.  Constitutional:  Appearance: Normal appearance. She is well-developed.  HENT:     Head: Atraumatic.     Nose: Nose normal.     Mouth/Throat:     Mouth: Mucous membranes are moist.  Eyes:     General: No scleral icterus.    Conjunctiva/sclera: Conjunctivae normal.     Pupils: Pupils are equal, round, and reactive to light.  Neck:     Trachea: No tracheal deviation.  Cardiovascular:     Rate and Rhythm: Normal rate and regular rhythm.     Pulses: Normal pulses.     Heart sounds: Normal heart sounds. No murmur. No friction rub. No gallop.   Pulmonary:     Effort: Pulmonary effort is normal. No respiratory distress.     Breath sounds: Normal breath  sounds.  Abdominal:     General: Bowel sounds are normal. There is no distension.     Palpations: Abdomen is soft.     Tenderness: There is no abdominal tenderness. There is no guarding.  Genitourinary:    Comments: No cva tenderness.  Musculoskeletal:        General: No swelling.     Cervical back: Normal range of motion and neck supple. No rigidity. No muscular tenderness.     Comments: CTLS spine, non tender, aligned, no step off. Good rom neck without pain or discomfort. Tenderness left hip and pain w rom, distal pulses palp bil. No other focal bony tenderness noted.   Skin:    General: Skin is warm and dry.     Findings: No rash.  Neurological:     Mental Status: She is alert.     Comments: Alert, speech clear. Moves bil extremities purposefully with good strength. sens grossly intact.   Psychiatric:        Mood and Affect: Mood normal.     ED Results / Procedures / Treatments   Labs (all labs ordered are listed, but only abnormal results are displayed) Results for orders placed or performed during the hospital encounter of 08/26/19  CBC  Result Value Ref Range   WBC 12.0 (H) 4.0 - 10.5 K/uL   RBC 2.97 (L) 3.87 - 5.11 MIL/uL   Hemoglobin 10.6 (L) 12.0 - 15.0 g/dL   HCT 65.6 (L) 81.2 - 75.1 %   MCV 108.1 (H) 80.0 - 100.0 fL   MCH 35.7 (H) 26.0 - 34.0 pg   MCHC 33.0 30.0 - 36.0 g/dL   RDW 70.0 17.4 - 94.4 %   Platelets 203 150 - 400 K/uL   nRBC 0.0 0.0 - 0.2 %   CT HEAD WO CONTRAST  Result Date: 08/26/2019 CLINICAL DATA:  86 year old female status post trip and fall backwards using walker today. Pain. EXAM: CT HEAD WITHOUT CONTRAST TECHNIQUE: Contiguous axial images were obtained from the base of the skull through the vertex without intravenous contrast. COMPARISON:  None. FINDINGS: Brain: No midline shift, mass effect, or evidence of intracranial mass lesion. No ventriculomegaly. No acute intracranial hemorrhage identified. Patchy and confluent bilateral cerebral white  matter hypodensity including the deep white matter capsules. Basal ganglia and posterior fossa appear relatively spared. No acute cortically based infarct or definite cortical encephalomalacia. Vascular: Mild for age Calcified atherosclerosis at the skull base. No suspicious intracranial vascular hyperdensity. Skull: Motion artifacts such that some images were repeated. No acute osseous abnormality identified. Sinuses/Orbits: Visualized paranasal sinuses and mastoids are clear. Other: No scalp hematoma identified. Negative visible orbits soft tissues. IMPRESSION: 1. No acute traumatic injury identified. 2. Bilateral  cerebral white matter changes most commonly due to chronic small vessel disease. Electronically Signed   By: Odessa Fleming M.D.   On: 08/26/2019 19:20   XR Hip Left  Result Date: 08/26/2019 CLINICAL DATA:  Fall.  Left hip pain EXAM: DG HIP (WITH OR WITHOUT PELVIS) 2-3V LEFT COMPARISON:  None. FINDINGS: There is a comminuted left femoral intertrochanteric fracture with varus angulation and displacement of fracture fragments. No subluxation or dislocation. SI joints symmetric and unremarkable. Advanced degenerative changes in the visualized lower lumbar spine. IMPRESSION: Left femoral intertrochanteric fracture with varus angulation and displacement. Electronically Signed   By: Charlett Nose M.D.   On: 08/26/2019 19:31     ED ECG REPORT   Date: 08/26/2019  Rate: 82  Rhythm: normal sinus rhythm and premature atrial contractions (PAC)  QRS Axis: left  Intervals: normal  ST/T Wave abnormalities: normal  Conduction Disutrbances:nonspecific intraventricular conduction delay  Narrative Interpretation:   Old EKG Reviewed: changes noted  I have personally reviewed the EKG tracing    Radiology CT HEAD WO CONTRAST  Result Date: 08/26/2019 CLINICAL DATA:  86 year old female status post trip and fall backwards using walker today. Pain. EXAM: CT HEAD WITHOUT CONTRAST TECHNIQUE: Contiguous axial  images were obtained from the base of the skull through the vertex without intravenous contrast. COMPARISON:  None. FINDINGS: Brain: No midline shift, mass effect, or evidence of intracranial mass lesion. No ventriculomegaly. No acute intracranial hemorrhage identified. Patchy and confluent bilateral cerebral white matter hypodensity including the deep white matter capsules. Basal ganglia and posterior fossa appear relatively spared. No acute cortically based infarct or definite cortical encephalomalacia. Vascular: Mild for age Calcified atherosclerosis at the skull base. No suspicious intracranial vascular hyperdensity. Skull: Motion artifacts such that some images were repeated. No acute osseous abnormality identified. Sinuses/Orbits: Visualized paranasal sinuses and mastoids are clear. Other: No scalp hematoma identified. Negative visible orbits soft tissues. IMPRESSION: 1. No acute traumatic injury identified. 2. Bilateral cerebral white matter changes most commonly due to chronic small vessel disease. Electronically Signed   By: Odessa Fleming M.D.   On: 08/26/2019 19:20   XR Hip Left  Result Date: 08/26/2019 CLINICAL DATA:  Fall.  Left hip pain EXAM: DG HIP (WITH OR WITHOUT PELVIS) 2-3V LEFT COMPARISON:  None. FINDINGS: There is a comminuted left femoral intertrochanteric fracture with varus angulation and displacement of fracture fragments. No subluxation or dislocation. SI joints symmetric and unremarkable. Advanced degenerative changes in the visualized lower lumbar spine. IMPRESSION: Left femoral intertrochanteric fracture with varus angulation and displacement. Electronically Signed   By: Charlett Nose M.D.   On: 08/26/2019 19:31    Procedures Procedures (including critical care time)  Medications Ordered in ED Medications - No data to display  ED Course  I have reviewed the triage vital signs and the nursing notes.  Pertinent labs & imaging results that were available during my care of the  patient were reviewed by me and considered in my medical decision making (see chart for details).    MDM Rules/Calculators/A&P                      Iv ns. Imaging ordered.  Reviewed nursing notes and prior charts for additional history.   Xrays reviewed/interpreted by me - left hip fx.   CT reviewed/interpreted by me - no acute hem.   Labs reviewed/interpreted by me - wbc normal, chem normal.  Orthopedist on call consulted re hip fx - discussed pt with Dr Charlann Boxer -  he will admit to his service, requests temp admit order be placed, npo post mn.   Morphine iv for pain.  Recheck pain improved. Distal pulses palp.      Final Clinical Impression(s) / ED Diagnoses Final diagnoses:  None    Rx / DC Orders ED Discharge Orders    None          Cathren Laine, MD 08/26/19 2045

## 2019-08-27 ENCOUNTER — Inpatient Hospital Stay (HOSPITAL_COMMUNITY): Payer: Medicare Other

## 2019-08-27 ENCOUNTER — Other Ambulatory Visit: Payer: Self-pay

## 2019-08-27 ENCOUNTER — Encounter (HOSPITAL_COMMUNITY)
Admission: EM | Disposition: A | Discharge status: Skilled Nursing Facility | Payer: Self-pay | Source: Skilled Nursing Facility | Attending: Orthopedic Surgery

## 2019-08-27 ENCOUNTER — Inpatient Hospital Stay (HOSPITAL_COMMUNITY): Payer: Medicare Other | Admitting: Anesthesiology

## 2019-08-27 ENCOUNTER — Encounter (HOSPITAL_COMMUNITY): Payer: Self-pay | Admitting: Orthopedic Surgery

## 2019-08-27 HISTORY — PX: INTRAMEDULLARY (IM) NAIL INTERTROCHANTERIC: SHX5875

## 2019-08-27 SURGERY — FIXATION, FRACTURE, INTERTROCHANTERIC, WITH INTRAMEDULLARY ROD
Anesthesia: Spinal | Site: Hip | Laterality: Left

## 2019-08-27 MED ORDER — ONDANSETRON HCL 4 MG/2ML IJ SOLN: 4.0000 mg | Freq: Four times a day (QID) | INTRAMUSCULAR | Status: DC | PRN

## 2019-08-27 MED ORDER — ENSURE PRE-SURGERY PO LIQD
296.0000 mL | Freq: Once | ORAL | Status: DC
  Filled 2019-08-27: qty 296

## 2019-08-27 MED ORDER — TRANEXAMIC ACID-NACL 1000-0.7 MG/100ML-% IV SOLN
1000.0000 mg | INTRAVENOUS | Status: AC
  Administered 2019-08-27: 1000 mg via INTRAVENOUS

## 2019-08-27 MED ORDER — FENTANYL CITRATE (PF) 100 MCG/2ML IJ SOLN
INTRAMUSCULAR | Status: AC
  Filled 2019-08-27: qty 2

## 2019-08-27 MED ORDER — DOCUSATE SODIUM 100 MG PO CAPS
100.0000 mg | ORAL_CAPSULE | Freq: Two times a day (BID) | ORAL | Status: DC
  Administered 2019-08-27 – 2019-09-04 (×15): 100 mg via ORAL
  Filled 2019-08-27 (×15): qty 1

## 2019-08-27 MED ORDER — ACETAMINOPHEN 500 MG PO TABS
ORAL_TABLET | ORAL | Status: AC
  Administered 2019-08-27: 1000 mg via ORAL
  Filled 2019-08-27: qty 2

## 2019-08-27 MED ORDER — ALBUMIN HUMAN 5 % IV SOLN
12.5000 g | Freq: Once | INTRAVENOUS | Status: AC
  Administered 2019-08-27: 12.5 g via INTRAVENOUS

## 2019-08-27 MED ORDER — HYDROCODONE-ACETAMINOPHEN 5-325 MG PO TABS: 1.0000 | ORAL_TABLET | ORAL | Status: DC | PRN

## 2019-08-27 MED ORDER — ONDANSETRON HCL 4 MG PO TABS: 4.0000 mg | ORAL_TABLET | Freq: Four times a day (QID) | ORAL | Status: DC | PRN

## 2019-08-27 MED ORDER — METOCLOPRAMIDE HCL 5 MG/ML IJ SOLN: 5.0000 mg | Freq: Three times a day (TID) | INTRAMUSCULAR | Status: DC | PRN

## 2019-08-27 MED ORDER — MEPIVACAINE HCL (PF) 2 % IJ SOLN
INTRAMUSCULAR | Status: DC | PRN
Start: 2019-08-27 — End: 2019-08-27

## 2019-08-27 MED ORDER — PHENYLEPHRINE HCL (PRESSORS) 10 MG/ML IV SOLN
INTRAVENOUS | Status: AC
  Filled 2019-08-27: qty 1

## 2019-08-27 MED ORDER — 0.9 % SODIUM CHLORIDE (POUR BTL) OPTIME
TOPICAL | Status: DC | PRN
  Administered 2019-08-27: 11:00:00 1000 mL

## 2019-08-27 MED ORDER — CEFAZOLIN SODIUM-DEXTROSE 2-4 GM/100ML-% IV SOLN
2.0000 g | INTRAVENOUS | Status: AC
  Administered 2019-08-27: 2 g via INTRAVENOUS

## 2019-08-27 MED ORDER — FENTANYL CITRATE (PF) 100 MCG/2ML IJ SOLN: 25.0000 ug | INTRAMUSCULAR | Status: DC | PRN

## 2019-08-27 MED ORDER — ACETAMINOPHEN 325 MG PO TABS: 325.0000 mg | ORAL_TABLET | Freq: Four times a day (QID) | ORAL | Status: DC | PRN

## 2019-08-27 MED ORDER — DEXTROSE 5 % IV SOLN: 3.0000 g | INTRAVENOUS | Status: DC

## 2019-08-27 MED ORDER — PROPOFOL 500 MG/50ML IV EMUL
INTRAVENOUS | Status: AC
  Filled 2019-08-27: qty 50

## 2019-08-27 MED ORDER — EPHEDRINE SULFATE-NACL 50-0.9 MG/10ML-% IV SOSY
PREFILLED_SYRINGE | INTRAVENOUS | Status: DC | PRN
  Administered 2019-08-27: 5 mg via INTRAVENOUS
  Administered 2019-08-27 (×2): 10 mg via INTRAVENOUS

## 2019-08-27 MED ORDER — CEFAZOLIN SODIUM-DEXTROSE 2-4 GM/100ML-% IV SOLN
INTRAVENOUS | Status: AC
  Filled 2019-08-27: qty 100

## 2019-08-27 MED ORDER — CHLORHEXIDINE GLUCONATE 4 % EX LIQD
60.0000 mL | Freq: Once | CUTANEOUS | Status: AC
  Administered 2019-08-27: 4 via TOPICAL

## 2019-08-27 MED ORDER — BISACODYL 10 MG RE SUPP: 10.0000 mg | Freq: Every day | RECTAL | Status: DC | PRN

## 2019-08-27 MED ORDER — TRANEXAMIC ACID-NACL 1000-0.7 MG/100ML-% IV SOLN
INTRAVENOUS | Status: AC
  Filled 2019-08-27: qty 100

## 2019-08-27 MED ORDER — CELECOXIB 200 MG PO CAPS
200.0000 mg | ORAL_CAPSULE | Freq: Two times a day (BID) | ORAL | Status: DC
  Administered 2019-08-27 – 2019-09-03 (×13): 200 mg via ORAL
  Filled 2019-08-27 (×15): qty 1

## 2019-08-27 MED ORDER — MEPIVACAINE HCL (PF) 2 % IJ SOLN
INTRAMUSCULAR | Status: AC
  Filled 2019-08-27: qty 20

## 2019-08-27 MED ORDER — MENTHOL 3 MG MT LOZG: 1.0000 | LOZENGE | OROMUCOSAL | Status: DC | PRN

## 2019-08-27 MED ORDER — FERROUS SULFATE 325 (65 FE) MG PO TABS
325.0000 mg | ORAL_TABLET | Freq: Three times a day (TID) | ORAL | Status: DC
  Administered 2019-08-28 – 2019-09-03 (×12): 325 mg via ORAL
  Filled 2019-08-27 (×12): qty 1

## 2019-08-27 MED ORDER — LACTATED RINGERS IV SOLN: INTRAVENOUS | Status: DC

## 2019-08-27 MED ORDER — PROPOFOL 10 MG/ML IV BOLUS
INTRAVENOUS | Status: AC
  Filled 2019-08-27: qty 20

## 2019-08-27 MED ORDER — FENTANYL CITRATE (PF) 100 MCG/2ML IJ SOLN
INTRAMUSCULAR | Status: DC | PRN
  Administered 2019-08-27: 25 ug via INTRAVENOUS

## 2019-08-27 MED ORDER — DEXAMETHASONE SODIUM PHOSPHATE 10 MG/ML IJ SOLN
INTRAMUSCULAR | Status: AC
  Filled 2019-08-27: qty 1

## 2019-08-27 MED ORDER — ACETAMINOPHEN 500 MG PO TABS: 1000.0000 mg | ORAL_TABLET | Freq: Once | ORAL | Status: AC

## 2019-08-27 MED ORDER — METOCLOPRAMIDE HCL 5 MG PO TABS: 5.0000 mg | ORAL_TABLET | Freq: Three times a day (TID) | ORAL | Status: DC | PRN

## 2019-08-27 MED ORDER — PROPOFOL 10 MG/ML IV BOLUS
INTRAVENOUS | Status: DC | PRN
  Administered 2019-08-27 (×5): 10 mg via INTRAVENOUS

## 2019-08-27 MED ORDER — PHENYLEPHRINE HCL-NACL 10-0.9 MG/250ML-% IV SOLN
INTRAVENOUS | Status: DC | PRN
  Administered 2019-08-27: 30 ug/min via INTRAVENOUS

## 2019-08-27 MED ORDER — ONDANSETRON HCL 4 MG/2ML IJ SOLN
INTRAMUSCULAR | Status: DC | PRN
  Administered 2019-08-27: 4 mg via INTRAVENOUS

## 2019-08-27 MED ORDER — CEFAZOLIN SODIUM-DEXTROSE 2-4 GM/100ML-% IV SOLN
2.0000 g | Freq: Four times a day (QID) | INTRAVENOUS | Status: AC
  Administered 2019-08-27 (×2): 2 g via INTRAVENOUS
  Filled 2019-08-27 (×2): qty 100

## 2019-08-27 MED ORDER — PHENOL 1.4 % MT LIQD
1.0000 | OROMUCOSAL | Status: DC | PRN
  Filled 2019-08-27: qty 177

## 2019-08-27 MED ORDER — ALBUMIN HUMAN 5 % IV SOLN
INTRAVENOUS | Status: AC
  Filled 2019-08-27: qty 250

## 2019-08-27 MED ORDER — GUAIFENESIN 100 MG/5ML PO SOLN: 10.0000 mL | ORAL | Status: DC | PRN

## 2019-08-27 MED ORDER — KETOROLAC TROMETHAMINE 15 MG/ML IJ SOLN: 15.0000 mg | Freq: Once | INTRAMUSCULAR | Status: DC | PRN

## 2019-08-27 MED ORDER — DIVALPROEX SODIUM 125 MG PO CSDR
125.0000 mg | DELAYED_RELEASE_CAPSULE | Freq: Every day | ORAL | Status: DC
  Administered 2019-08-28 – 2019-09-04 (×7): 125 mg via ORAL
  Filled 2019-08-27 (×10): qty 1

## 2019-08-27 MED ORDER — DEXAMETHASONE SODIUM PHOSPHATE 10 MG/ML IJ SOLN
INTRAMUSCULAR | Status: DC | PRN
  Administered 2019-08-27: 8 mg via INTRAVENOUS

## 2019-08-27 MED ORDER — MAGNESIUM CITRATE PO SOLN: 1.0000 | Freq: Once | ORAL | Status: DC | PRN

## 2019-08-27 MED ORDER — POVIDONE-IODINE 10 % EX SWAB: 2.0000 "application " | Freq: Once | CUTANEOUS | Status: DC

## 2019-08-27 MED ORDER — ONDANSETRON HCL 4 MG/2ML IJ SOLN
INTRAMUSCULAR | Status: AC
  Filled 2019-08-27: qty 2

## 2019-08-27 MED ORDER — ASPIRIN EC 325 MG PO TBEC
325.0000 mg | DELAYED_RELEASE_TABLET | Freq: Two times a day (BID) | ORAL | Status: DC
  Administered 2019-08-28: 325 mg via ORAL
  Filled 2019-08-27: qty 1

## 2019-08-27 MED ORDER — MEPIVACAINE HCL (PF) 2 % IJ SOLN
INTRAMUSCULAR | Status: DC | PRN
  Administered 2019-08-27: 3 mL via INTRATHECAL

## 2019-08-27 MED ORDER — PHENYLEPHRINE 40 MCG/ML (10ML) SYRINGE FOR IV PUSH (FOR BLOOD PRESSURE SUPPORT)
PREFILLED_SYRINGE | INTRAVENOUS | Status: DC | PRN
Start: 2019-08-27 — End: 2019-08-27
  Administered 2019-08-27 (×3): 80 ug via INTRAVENOUS

## 2019-08-27 MED ORDER — PROPOFOL 500 MG/50ML IV EMUL
INTRAVENOUS | Status: DC | PRN
  Administered 2019-08-27: 75 ug/kg/min via INTRAVENOUS

## 2019-08-27 MED ORDER — AMLODIPINE BESYLATE 5 MG PO TABS
5.0000 mg | ORAL_TABLET | Freq: Every day | ORAL | Status: DC
  Administered 2019-08-27 – 2019-09-04 (×8): 5 mg via ORAL
  Filled 2019-08-27 (×8): qty 1

## 2019-08-27 MED ORDER — ONDANSETRON HCL 4 MG/2ML IJ SOLN: 4.0000 mg | Freq: Once | INTRAMUSCULAR | Status: DC | PRN

## 2019-08-27 SURGICAL SUPPLY — 40 items
BAG ZIPLOCK 12X15 (MISCELLANEOUS) ×3 IMPLANT
BIT DRILL CANN LG 4.3MM (BIT) ×1 IMPLANT
COVER PERINEAL POST (MISCELLANEOUS) ×3 IMPLANT
COVER SURGICAL LIGHT HANDLE (MISCELLANEOUS) ×3 IMPLANT
COVER WAND RF STERILE (DRAPES) IMPLANT
DERMABOND ADVANCED (GAUZE/BANDAGES/DRESSINGS) ×2
DERMABOND ADVANCED .7 DNX12 (GAUZE/BANDAGES/DRESSINGS) ×1 IMPLANT
DRAPE STERI IOBAN 125X83 (DRAPES) ×3 IMPLANT
DRILL BIT CANN LG 4.3MM (BIT) ×3
DRSG AQUACEL AG ADV 3.5X 4 (GAUZE/BANDAGES/DRESSINGS) IMPLANT
DRSG AQUACEL AG ADV 3.5X 6 (GAUZE/BANDAGES/DRESSINGS) ×6 IMPLANT
DURAPREP 26ML APPLICATOR (WOUND CARE) ×3 IMPLANT
ELECT REM PT RETURN 15FT ADLT (MISCELLANEOUS) ×3 IMPLANT
FACESHIELD WRAPAROUND (MASK) ×3 IMPLANT
GLOVE BIOGEL PI IND STRL 7.5 (GLOVE) ×2 IMPLANT
GLOVE BIOGEL PI IND STRL 8.5 (GLOVE) ×1 IMPLANT
GLOVE BIOGEL PI INDICATOR 7.5 (GLOVE) ×4
GLOVE BIOGEL PI INDICATOR 8.5 (GLOVE) ×2
GLOVE ECLIPSE 8.0 STRL XLNG CF (GLOVE) ×9 IMPLANT
GLOVE ORTHO TXT STRL SZ7.5 (GLOVE) ×6 IMPLANT
GOWN STRL REUS W/TWL 2XL LVL3 (GOWN DISPOSABLE) ×3 IMPLANT
GOWN STRL REUS W/TWL LRG LVL3 (GOWN DISPOSABLE) ×6 IMPLANT
GUIDEPIN 3.2X17.5 THRD DISP (PIN) ×3 IMPLANT
HFN LAG SCREW 10.5MM X 115MM (Orthopedic Implant) ×3 IMPLANT
KIT BASIN OR (CUSTOM PROCEDURE TRAY) ×3 IMPLANT
KIT TURNOVER KIT A (KITS) IMPLANT
MANIFOLD NEPTUNE II (INSTRUMENTS) ×3 IMPLANT
NAIL HIP FRACT 130D 11X180 (Screw) ×3 IMPLANT
PACK GENERAL/GYN (CUSTOM PROCEDURE TRAY) ×3 IMPLANT
PADDING CAST COTTON 6X4 STRL (CAST SUPPLIES) ×3 IMPLANT
PENCIL SMOKE EVACUATOR (MISCELLANEOUS) IMPLANT
PROTECTOR NERVE ULNAR (MISCELLANEOUS) ×3 IMPLANT
SCREW BONE CORTICAL 5.0X36 (Screw) ×3 IMPLANT
SCREW LAG 10.5MMX105MM HFN (Screw) ×3 IMPLANT
SUT MNCRL AB 4-0 PS2 18 (SUTURE) ×3 IMPLANT
SUT VIC AB 1 CT1 36 (SUTURE) ×3 IMPLANT
SUT VIC AB 2-0 CT1 27 (SUTURE) ×4
SUT VIC AB 2-0 CT1 27XBRD (SUTURE) ×2 IMPLANT
TOWEL OR 17X26 10 PK STRL BLUE (TOWEL DISPOSABLE) ×3 IMPLANT
TOWEL OR NON WOVEN STRL DISP B (DISPOSABLE) IMPLANT

## 2019-08-27 NOTE — Op Note (Signed)
Sheila Ford, [place] MEDICAL RECORD [id number] ACCOUNT [id number] DATE OF BIRTH:October 25, 1921 FACILITY: [facility] LOCATION: [facility] PHYSICIAN:Katielynn Horan Sheila Sorrow, MD  OPERATIVE REPORT  DATE OF PROCEDURE:  08/27/2019  PREOPERATIVE DIAGNOSIS:  Displaced comminuted left intertrochanteric femur fracture.  POSTOPERATIVE DIAGNOSIS:  Displaced comminuted left intertrochanteric femur fracture.  PROCEDURE:  Open reduction internal fixation of left intertrochanteric fracture with a [employer] Aff[employer]us trochanteric nail, 180 x 11 mm in diameter with a 115 mm lag screw and a distal interlock.  SURGEON:  Paralee Cancel, MD  ASSISTANT:  Danae Orleans, PA-C.  Note that Mr. Guinevere Scarlet was present for the entirety of the case from preoperative positioning, perioperative management of the operative extremity, general facilitation of the case and primary wound closure.  ANESTHESIA:  Spinal.  ESTIMATED BLOOD LOSS:  Less  than 100 mL.  COMPLICATIONS:  None.  DRAINS:  None.  INDICATIONS:  The patient is a 86 year old female with a history of dementia who currently resides in a memory care unit.  She unfortunately had a fall at the facility, inability to bear weight and complaints of pain.  She was brought to the emergency  room where radiographs revealed a comminuted displaced left intertrochanteric femur fracture.  She was admitted to the hospital and scheduled for surgery the next day.  Risks and benefits, necessity and recommendations for surgery were discussed with her  daughter.  Consent was obtained for management.  Risks and concerns of surgery in a 86 year old female including medical comorbidities, would also be bone quality in terms of healing potential and failure of fixation.  Consent was obtained for benefit  of fracture management.  DESCRIPTION OF PROCEDURE:  The patient was brought to the operative theater.  Once adequate anesthesia, preoperative antibiotics, Ancef administered, she was positioned supine  on the fracture table with all bony prominences carefully padded and  protected.  Fluoroscopy was then used to confirm reduction with traction and internal rotation to a nearly anatomic position.  At this point, the left hip was then prepped and draped in sterile fashion.  A timeout was performed identifying the patient,  the planned procedure and extremity.  Fluoroscopy was then brought back to the field.  The tip of the trochanter was identified.  An incision was then made lateral and proximal to this.  Soft tissue dissection was carried through the gluteal fascia.  A  guidewire was then inserted into the tip of the trochanter, which was displaced posteriorly and then passed across the fracture site.  This was confirmed radiographically.  Once this was done, the proximal femur was drilled open.  An 11 x 180 mm nail  with 130-degree lag screw opening was then passed by hand.  When I did this, there was some displacement of the calcar medially, but I did feel that I would be able to compress enough to provide support to this for fixation purposes.  Once this was done,  the guidewire was then inserted into the femoral head.  I purposely placed this slightly inferior and posterior to allow for potential issues associated with her osteoporotic bone.  I measured and selected 115 mm lag screw.  This screw was passed and  then I did do some medial compression, trying to compress the shaft to the fracture.  I then locked the top bolt and backed it off a quarter turn to allow for further compression.  Again, there was still displacement of a few millimeters of the medial  calcar; however, overall alignment appeared to be stable.  Distal interlock  was placed through the insertion jig, measuring 38 mm.  We then removed the jig and took final pictures in AP and lateral planes.  Lateral radiograph indicated a stable  anatomically reduced fracture.  However, on the AP view, there was still some displacement of a few  millimeters.  The wounds were irrigated with normal saline solution.  The proximal wound was closed in layers with #1 Vicryl, 2-0 Vicryl and Monocryl.   The remaining wounds were closed with 2-0 Vicryl and glue.  The hip and thigh were cleaned, dried and dressed sterilely using surgical glue and Aquacel dressing.  She was then brought to the recovery room in stable condition.  Postoperatively, based on her memory issues, we will allow her to be weightbearing as tolerated for transfers.  It will be hard for her to follow any directions.  We will follow in the office upon discharge for fracture healing and complications.  VN/NUANCE  D:08/27/2019 T:08/27/2019 JOB:[id number]

## 2019-08-27 NOTE — Anesthesia Postprocedure Evaluation (Signed)
Anesthesia Post Note  Patient: Sheila Ford  Procedure(s) Performed: LEFT INTRAMEDULLARY (IM) NAIL INTERTROCHANTRIC (Left Hip)     Patient location during evaluation: PACU Anesthesia Type: Spinal and MAC Level of consciousness: awake and alert Pain management: pain level controlled Vital Signs Assessment: post-procedure vital signs reviewed and stable Respiratory status: spontaneous breathing, nonlabored ventilation and respiratory function stable Cardiovascular status: blood pressure returned to baseline and stable Postop Assessment: no apparent nausea or vomiting Anesthetic complications: no    Last Vitals:  Vitals:   08/27/19 1400 08/27/19 1415  BP: (!) 104/57 (!) 102/40  Pulse: 64 66  Resp: (!) 21 (!) 32  Temp:  (!) 36.2 C  SpO2: 95% 95%    Last Pain:  Vitals:   08/27/19 1017  TempSrc: Oral                 Pervis Hocking

## 2019-08-27 NOTE — Transfer of Care (Signed)
Immediate Anesthesia Transfer of Care Note  Patient: Sheila Ford  Procedure(s) Performed: LEFT INTRAMEDULLARY (IM) NAIL INTERTROCHANTRIC (Left Hip)  Patient Location: PACU  Anesthesia Type:Spinal  Level of Consciousness: awake  Airway & Oxygen Therapy: Patient Spontanous Breathing and Patient connected to face mask oxygen  Post-op Assessment: Report given to RN and Post -op Vital signs reviewed and stable  Post vital signs: Reviewed and stable  Last Vitals:  Vitals Value Taken Time  BP 108/49 08/27/19 1255  Temp    Pulse    Resp 15 08/27/19 1257  SpO2    Vitals shown include unvalidated device data.  Last Pain:  Vitals:   08/27/19 1017  TempSrc: Oral         Complications: No apparent anesthesia complications

## 2019-08-27 NOTE — Brief Op Note (Signed)
08/26/2019 - 08/27/2019  11:29 AM  PATIENT:  Sheila Ford  86 y.o. female  PRE-OPERATIVE DIAGNOSIS:  Left intertrochanteric hip fracture  POST-OPERATIVE DIAGNOSIS:   Left intertrochanteric hip fracture  PROCEDURE:  Procedure(s): INTRAMEDULLARY (IM) NAIL INTERTROCHANTRIC (Left)  ORIF  SURGEON:  Surgeon(s) and Role:    Durene Romans, MD - Primary  PHYSICIAN ASSISTANT: Lanney Gins, PA-C  ANESTHESIA:   spinal  EBL:  <100 cc  BLOOD ADMINISTERED:none  DRAINS: none   LOCAL MEDICATIONS USED:  NONE  SPECIMEN:  No Specimen  DISPOSITION OF SPECIMEN:  N/A  COUNTS:  YES  TOURNIQUET:  * No tourniquets in log *  DICTATION: .Other Dictation: Dictation Number [id number]  PLAN OF CARE: Admit to inpatient   PATIENT DISPOSITION:  PACU - hemodynamically stable.   Delay start of Pharmacological VTE agent (>24hrs) due to surgical blood loss or risk of bleeding: no

## 2019-08-27 NOTE — Anesthesia Procedure Notes (Signed)
Procedure Name: MAC Date/Time: 08/27/2019 11:20 AM Performed by: Niel Hummer, CRNA Pre-anesthesia Checklist: Patient identified, Emergency Drugs available, Suction available and Patient being monitored Oxygen Delivery Method: Simple face mask

## 2019-08-27 NOTE — Anesthesia Procedure Notes (Signed)
Spinal  Patient location during procedure: OR Start time: 08/27/2019 11:20 AM End time: 08/27/2019 11:30 AM Staffing Performed: anesthesiologist  Anesthesiologist: Lannie Fields, DO Preanesthetic Checklist Completed: patient identified, IV checked, site marked, risks and benefits discussed, surgical consent, monitors and equipment checked, pre-op evaluation and timeout performed Spinal Block Patient position: sitting Prep: DuraPrep and site prepped and draped Patient monitoring: heart rate, cardiac monitor, continuous pulse ox and blood pressure Approach: midline Location: L3-4 Injection technique: single-shot Needle Needle type: Sprotte  Needle gauge: 24 G Needle length: 9 cm Assessment Sensory level: T4 Additional Notes Functioning IV was confirmed and monitors were applied. Sterile prep and drape, including hand hygiene and sterile gloves were used. The patient was positioned and the spine was prepped. The skin was anesthetized with lidocaine.  Free flow of clear CSF was obtained prior to injecting local anesthetic into the CSF.  The spinal needle aspirated freely following injection.  The needle was carefully withdrawn.  The patient tolerated the procedure well.

## 2019-08-27 NOTE — H&P (Addendum)
Sheila Ford is an 86 y.o. female.   Chief Complaint: Left hip pain HPI: Sheila Ford is a 86 year old female with a history of dementia who presented to the ED via EMS with complaints of left hip pain after a fall on 08/26/19. She reports tripping over her walker, and falling onto her back and left side. She denies LOC or other injury. She resides in an extended memory care facility. She is not taking any blood thinners.   She was evaluated in the [facility] ED, and found to have a left femoral intertrochanteric fracture by x-ray. Dr. Charlann Boxer was consulted for orthopaedic management.  Past Medical History:  Diagnosis Date  . Glaucoma   . Hypertension     History reviewed. No pertinent surgical history.  Family History  Problem Relation Age of Onset  . Arthritis Daughter   . Hypertension Daughter   . Miscarriages / [place] Daughter   . Diabetes Son   . Hypertension Son   . Cancer Sister   . Early death Sister   . Heart disease Brother    Social History:  reports that she has never smoked. She has never used smokeless tobacco. She reports that she does not drink alcohol or use drugs.  Allergies: No Known Allergies  (Not in a hospital admission)   Results for orders placed or performed during the hospital encounter of 08/26/19 (from the past 48 hour(s))  CBC     Status: Abnormal   Collection Time: 08/26/19  8:22 PM  Result Value Ref Range   WBC 12.0 (H) 4.0 - 10.5 K/uL   RBC 2.97 (L) 3.87 - 5.11 MIL/uL   Hemoglobin 10.6 (L) 12.0 - 15.0 g/dL   HCT 10.6 (L) 26.9 - 48.5 %   MCV 108.1 (H) 80.0 - 100.0 fL   MCH 35.7 (H) 26.0 - 34.0 pg   MCHC 33.0 30.0 - 36.0 g/dL   RDW 46.2 70.3 - 50.0 %   Platelets 203 150 - 400 K/uL   nRBC 0.0 0.0 - 0.2 %    Comment: Performed at [facility], [address], [place], [place] [address]  CMET     Status: Abnormal   Collection Time: 08/26/19  8:22 PM  Result Value Ref Range   Sodium 137 135 - 145 mmol/L   Potassium 3.8  3.5 - 5.1 mmol/L   Chloride 101 98 - 111 mmol/L   CO2 28 22 - 32 mmol/L   Glucose, Bld 182 (H) 70 - 99 mg/dL   BUN 17 8 - 23 mg/dL   Creatinine, Ser 2.99 0.44 - 1.00 mg/dL   Calcium 8.8 (L) 8.9 - 10.3 mg/dL   Total Protein 7.0 6.5 - 8.1 g/dL   Albumin 3.6 3.5 - 5.0 g/dL   AST 34 15 - 41 U/L   ALT 30 0 - 44 U/L   Alkaline Phosphatase 72 38 - 126 U/L   Total Bilirubin 1.1 0.3 - 1.2 mg/dL   GFR calc non Af Amer >60 >60 mL/min   GFR calc Af Amer >60 >60 mL/min   Anion gap 8 5 - 15    Comment: Performed at [facility], [address] [place], [place] [address]  Type and screen     Status: None   Collection Time: 08/26/19  8:22 PM  Result Value Ref Range   ABO/RH(D) O POS    Antibody Screen NEG    Sample Expiration      08/29/2019,2359 Performed at  [facility], [address], [place], [place] [address]   Respiratory Panel by RT PCR (Flu A&B, Covid) - Nasopharyngeal Swab     Status: None   Collection Time: 08/26/19  8:22 PM   Specimen: Nasopharyngeal Swab  Result Value Ref Range   SARS Coronavirus 2 by RT PCR NEGATIVE NEGATIVE    Comment: (NOTE) SARS-CoV-2 target nucleic acids are NOT DETECTED. The SARS-CoV-2 RNA is generally detectable in upper respiratoy specimens during the acute phase of infection. The lowest concentration of SARS-CoV-2 viral copies this assay can detect is 131 copies/mL. A negative result does not preclude SARS-Cov-2 infection and should not be used as the sole basis for treatment or other patient management decisions. A negative result may occur with  improper specimen collection/handling, submission of specimen other than nasopharyngeal swab, presence of viral mutation(s) within the areas targeted by this assay, and inadequate number of viral copies (<131 copies/mL). A negative result must be combined with clinical observations, patient history, and epidemiological information. The expected result is  Negative. Fact Sheet for Patients:  https://www.moore.com/ Fact Sheet for Healthcare Providers:  https://www.young.biz/ This test is not yet ap proved or cleared by the [place] FDA and  has been authorized for detection and/or diagnosis of SARS-CoV-2 by FDA under an Emergency Use Authorization (EUA). This EUA will remain  in effect (meaning this test can be used) for the duration of the COVID-19 declaration under Section 564(b)(1) of the Act, 21 U.S.C. section 360bbb-3(b)(1), unless the authorization is terminated or revoked sooner.    Influenza A by PCR NEGATIVE NEGATIVE   Influenza B by PCR NEGATIVE NEGATIVE    Comment: (NOTE) The Xpert Xpress SARS-CoV-2/FLU/RSV assay is intended as an aid in  the diagnosis of influenza from Nasopharyngeal swab specimens and  should not be used as a sole basis for treatment. Nasal washings and  aspirates are unacceptable for Xpert Xpress SARS-CoV-2/FLU/RSV  testing. Fact Sheet for Patients: https://www.moore.com/ Fact Sheet for Healthcare Providers: https://www.young.biz/ This test is not yet approved or cleared by the [place] FDA and  has been authorized for detection and/or diagnosis of SARS-CoV-2 by  FDA under an Emergency Use Authorization (EUA). This EUA will remain  in effect (meaning this test can be used) for the duration of the  Covid-19 declaration under Section 564(b)(1) of the Act, 21  U.S.C. section 360bbb-3(b)(1), unless the authorization is  terminated or revoked. Performed at [facility], [address] [place], [place] [address]   ABO/Rh     Status: None   Collection Time: 08/26/19  8:25 PM  Result Value Ref Range   ABO/RH(D)      O POS Performed at [facility], [address], [place], [place] [address]   UA     Status: Abnormal   Collection Time: 08/26/19 11:14 PM  Result Value Ref Range    Color, Urine YELLOW YELLOW   APPearance HAZY (A) CLEAR   Specific Gravity, Urine 1.012 1.005 - 1.030   pH 6.0 5.0 - 8.0   Glucose, UA NEGATIVE NEGATIVE mg/dL   Hgb urine dipstick NEGATIVE NEGATIVE   Bilirubin Urine NEGATIVE NEGATIVE   Ketones, ur NEGATIVE NEGATIVE mg/dL   Protein, ur NEGATIVE NEGATIVE mg/dL   Nitrite NEGATIVE NEGATIVE   Leukocytes,Ua NEGATIVE NEGATIVE    Comment: Performed at [facility], [address] [place], [place] [address]   XR Chest Preop 1 View  Result Date: 08/26/2019 CLINICAL DATA:  Preop for  hip fracture. EXAM: CHEST  1 VIEW COMPARISON:  05/22/2019 FINDINGS: Patient rotated left. Chin overlies the apices. Mild cardiomegaly. No pleural effusion or pneumothorax. Clear lungs. Right apical pleuroparenchymal scarring. Moderate S shaped thoracolumbar spine curvature. IMPRESSION: No acute cardiopulmonary disease. Electronically Signed   By: Abigail Miyamoto M.D.   On: 08/26/2019 20:07   CT HEAD WO CONTRAST  Result Date: 08/26/2019 CLINICAL DATA:  86 year old female status post trip and fall backwards using walker today. Pain. EXAM: CT HEAD WITHOUT CONTRAST TECHNIQUE: Contiguous axial images were obtained from the base of the skull through the vertex without intravenous contrast. COMPARISON:  None. FINDINGS: Brain: No midline shift, mass effect, or evidence of intracranial mass lesion. No ventriculomegaly. No acute intracranial hemorrhage identified. Patchy and confluent bilateral cerebral white matter hypodensity including the deep white matter capsules. Basal ganglia and posterior fossa appear relatively spared. No acute cortically based infarct or definite cortical encephalomalacia. Vascular: Mild for age Calcified atherosclerosis at the skull base. No suspicious intracranial vascular hyperdensity. Skull: Motion artifacts such that some images were repeated. No acute osseous abnormality identified. Sinuses/Orbits: Visualized paranasal sinuses and mastoids  are clear. Other: No scalp hematoma identified. Negative visible orbits soft tissues. IMPRESSION: 1. No acute traumatic injury identified. 2. Bilateral cerebral white matter changes most commonly due to chronic small vessel disease. Electronically Signed   By: Genevie Ann M.D.   On: 08/26/2019 19:20   XR Hip Left  Result Date: 08/26/2019 CLINICAL DATA:  Fall.  Left hip pain EXAM: DG HIP (WITH OR WITHOUT PELVIS) 2-3V LEFT COMPARISON:  None. FINDINGS: There is a comminuted left femoral intertrochanteric fracture with varus angulation and displacement of fracture fragments. No subluxation or dislocation. SI joints symmetric and unremarkable. Advanced degenerative changes in the visualized lower lumbar spine. IMPRESSION: Left femoral intertrochanteric fracture with varus angulation and displacement. Electronically Signed   By: Rolm Baptise M.D.   On: 08/26/2019 19:31    Review of Systems  Dementia prevents suitable evaluation  Blood pressure (!) 117/56, pulse 68, temperature 97.9 F (36.6 C), temperature source Oral, resp. rate 15, SpO2 92 %. Physical Exam  Musculoskeletal: Awake, not oriented Pulling on cords around her  Left lower extremity: Skin intact over the left hip. Tenderness to palpation about the left hip. Distal pulses and sensation intact. Pain with movement   Assessment/Plan Left hip intertrochanteric femur fracture   Admit to Sheila Ford due to Hospitalist burden  We will plan on proceeding with ORIF left hip fracture, with intramedullary nail on 08/27/19. Dr. Alvan Dame will discuss the procedure, including risks, benefits, and expectations with the patient or family on the morning of surgery.   At this time based on her age and medical co morbidities I do not see the need to consult the Hospitalist service for clearance   I spoke with her daughter and explained the injury.  Fixation of fracture will help with pain control for movements as well as daily care. OR today WBAT LLE  afterwards Disposition pending SW discussion with daughter in post op period   Sheila Ford, Sheila Ford 08/27/2019, 6:38 AM

## 2019-08-27 NOTE — Interval H&P Note (Signed)
History and Physical Interval Note:  08/27/2019 10:06 AM  Sheila Ford  has presented today for surgery, with the diagnosis of Left intertrochanteric hip fracture.  The various methods of treatment have been discussed with the patient and family. After consideration of risks, benefits and other options for treatment, the patient has consented to  Procedure(s): INTRAMEDULLARY (IM) NAIL INTERTROCHANTRIC (Left) as a surgical intervention.  The patient's history has been reviewed, patient examined, no change in status, stable for surgery.  I have reviewed the patient's chart and labs.  Questions were answered to the patient's satisfaction.     [name]

## 2019-08-27 NOTE — Anesthesia Preprocedure Evaluation (Addendum)
Anesthesia Evaluation  Patient identified by MRN, date of birth, ID band Patient awake    Reviewed: Allergy & Precautions, NPO status , Patient's Chart, lab work & pertinent test results  Airway Mallampati: II  TM Distance: >3 FB Neck ROM: Full    Dental no notable dental hx.    Pulmonary neg pulmonary ROS,    Pulmonary exam normal breath sounds clear to auscultation       Cardiovascular hypertension, Pt. on medications negative cardio ROS Normal cardiovascular exam Rhythm:Regular Rate:Normal     Neuro/Psych PSYCHIATRIC DISORDERS Dementia negative neurological ROS     GI/Hepatic negative GI ROS, Neg liver ROS,   Endo/Other  negative endocrine ROS  Renal/GU negative Renal ROS  negative genitourinary   Musculoskeletal negative musculoskeletal ROS (+)   Abdominal   Peds negative pediatric ROS (+)  Hematology negative hematology ROS (+) hct 32, plt 203   Anesthesia Other Findings Left hip fracture  Reproductive/Obstetrics negative OB ROS                            Anesthesia Physical Anesthesia Plan  ASA: III  Anesthesia Plan: Spinal   Post-op Pain Management:    Induction:   PONV Risk Score and Plan: 2 and Propofol infusion and TIVA  Airway Management Planned: Natural Airway and Nasal Cannula  Additional Equipment: None  Intra-op Plan:   Post-operative Plan:   Informed Consent: I have reviewed the patients History and Physical, chart, labs and discussed the procedure including the risks, benefits and alternatives for the proposed anesthesia with the patient or authorized representative who has indicated his/her understanding and acceptance.       Plan Discussed with: CRNA  Anesthesia Plan Comments:         Anesthesia Quick Evaluation

## 2019-08-28 LAB — BASIC METABOLIC PANEL
Anion gap: 8 (ref 5–15)
BUN: 20 mg/dL (ref 8–23)
CO2: 24 mmol/L (ref 22–32)
Calcium: 8.2 mg/dL — ABNORMAL LOW (ref 8.9–10.3)
Chloride: 108 mmol/L (ref 98–111)
Creatinine, Ser: 0.95 mg/dL (ref 0.44–1.00)
GFR calc Af Amer: 59 mL/min — ABNORMAL LOW (ref >60)
GFR calc non Af Amer: 51 mL/min — ABNORMAL LOW (ref >60)
Glucose, Bld: 138 mg/dL — ABNORMAL HIGH (ref 70–99)
Potassium: 4.6 mmol/L (ref 3.5–5.1)
Sodium: 140 mmol/L (ref 135–145)

## 2019-08-28 LAB — CBC
HCT: 32 % — ABNORMAL LOW (ref 36.0–46.0)
Hemoglobin: 10.4 g/dL — ABNORMAL LOW (ref 12.0–15.0)
MCH: 35.1 pg — ABNORMAL HIGH (ref 26.0–34.0)
MCHC: 32.5 g/dL (ref 30.0–36.0)
MCV: 108.1 fL — ABNORMAL HIGH (ref 80.0–100.0)
Platelets: 129 10*3/uL — ABNORMAL LOW (ref 150–400)
RBC: 2.96 MIL/uL — ABNORMAL LOW (ref 3.87–5.11)
RDW: 13.8 % (ref 11.5–15.5)
WBC: 7 10*3/uL (ref 4.0–10.5)
nRBC: 0 % (ref 0.0–0.2)

## 2019-08-28 MED ORDER — TRAMADOL HCL 50 MG PO TABS
50.0000 mg | ORAL_TABLET | Freq: Three times a day (TID) | ORAL | Status: DC | PRN
  Administered 2019-08-28 (×2): 50 mg via ORAL
  Filled 2019-08-28 (×2): qty 1

## 2019-08-28 MED ORDER — ASPIRIN 81 MG PO CHEW
81.0000 mg | CHEWABLE_TABLET | Freq: Two times a day (BID) | ORAL | Status: DC
  Administered 2019-08-28 – 2019-09-04 (×12): 81 mg via ORAL
  Filled 2019-08-28 (×13): qty 1

## 2019-08-28 MED ORDER — ASPIRIN 81 MG PO CHEW: 81.0000 mg | CHEWABLE_TABLET | Freq: Two times a day (BID) | ORAL | 0 refills | Status: AC

## 2019-08-28 MED ORDER — METHOCARBAMOL 500 MG PO TABS: 500.0000 mg | ORAL_TABLET | Freq: Four times a day (QID) | ORAL | 0 refills | Status: AC | PRN

## 2019-08-28 MED ORDER — TRAMADOL HCL 50 MG PO TABS: 50.0000 mg | ORAL_TABLET | Freq: Four times a day (QID) | ORAL | 0 refills | Status: AC | PRN

## 2019-08-28 MED ORDER — ACETAMINOPHEN 500 MG PO TABS: 1000.0000 mg | ORAL_TABLET | Freq: Three times a day (TID) | ORAL | 0 refills | Status: AC

## 2019-08-28 NOTE — Progress Notes (Signed)
     Subjective: 1 Day Post-Op Procedure(s) (LRB): LEFT INTRAMEDULLARY (IM) NAIL INTERTROCHANTRIC (Left)   Patient resting comfortably in bed.  No reported events throughout the night. Discharge disposition TBD.  Objective:   VITALS:   Vitals:   08/28/19 0155 08/28/19 0607  BP: (!) 102/57 115/77  Pulse: 76 75  Resp: 18 16  Temp: 98.3 F (36.8 C) 98.1 F (36.7 C)  SpO2:  97%    Dorsiflexion/Plantar flexion intact Incision: dressing C/D/I No cellulitis present Compartment soft  LABS Recent Labs    08/26/19 2022 08/28/19 0403  HGB 10.6* 10.4*  HCT 32.1* 32.0*  WBC 12.0* 7.0  PLT 203 129*    Recent Labs    08/26/19 2022 08/28/19 0403  NA 137 140  K 3.8 4.6  BUN 17 20  CREATININE 0.75 0.95  GLUCOSE 182* 138*     Assessment/Plan: 1 Day Post-Op Procedure(s) (LRB): LEFT INTRAMEDULLARY (IM) NAIL INTERTROCHANTRIC (Left) Foley cath d/c'ed Advance diet Up with therapy D/C IV fluids Discharge disposition TBD Rx's printed and placed on the chart     Anastasio Auerbach. Nika Yazzie   PAC  08/28/2019, 8:45 AM

## 2019-08-28 NOTE — Evaluation (Signed)
Physical Therapy Evaluation Patient Details Name: Sheila Ford MRN: [id number] DOB: [birth year] Today's Date: 08/28/2019   History of Present Illness  Ms. Attridge is a 86 year old female with a history of dementia who presented to the ED via EMS with complaints of left hip pain after a fall on 08/26/19. She reports tripping over her walker, and falling onto her back and left side. She denies LOC or other injury. She resides in an extended memory care facility. She is not taking any blood thinners.  She was evaluated in the [facility] ED, and found to have a left femoral intertrochanteric fracture by x-ray. s/p IMN on 08/27/19.  Clinical Impression  Pt admitted with above diagnosis, had a fall at nursing home with L intramedullary nail placed 08/27/19. Pt able to follow one step commands, prefers to do tasks alone with mild agitation when therapist attempts to help requiring second person for safety, but not combative. Pt impulsive with steps at bedside ditching RW to hold therapist's arms when weight-bearing on LLE despite education on WBAT, recent fall, surgical interventions, location, etc. Pt currently with functional limitations due to the deficits listed below (see PT Problem List). Pt will benefit from skilled PT to increase their independence and safety with mobility to allow discharge to the venue listed below.       Follow Up Recommendations SNF    Equipment Recommendations  None recommended by PT    Recommendations for Other Services       Precautions / Restrictions Precautions Precautions: Fall Restrictions Weight Bearing Restrictions: No      Mobility  Bed Mobility Overal bed mobility: Needs Assistance Bed Mobility: Supine to Sit     Supine to sit: Min assist     General bed mobility comments: min assist/initiation cues for LLE moving to EOB  Transfers Overall transfer level: Needs assistance Equipment used: Rolling walker (2 wheeled) Transfers: Sit to/from  Stand Sit to Stand: Min guard         General transfer comment: impulsive, reports "back up" when therapist attempts to assist pt into standing, able to power up with LLE extended and decreased LLE weight-bearing in standing  Ambulation/Gait Ambulation/Gait assistance: Mod assist Gait Distance (Feet): 2 Feet Assistive device: Rolling walker (2 wheeled) Gait Pattern/deviations: Decreased weight shift to left;Shuffle;Antalgic Gait velocity: decreased   General Gait Details: limited to 2-3 steps at bedside, impulsive, releases RW and grabs ahold of therapists arms when taking steps due to pain with LLE weight-bearing, calms with verbal cues  Stairs            Wheelchair Mobility    Modified Rankin (Stroke Patients Only)       Balance Overall balance assessment: Needs assistance Sitting-balance support: Feet supported;No upper extremity supported Sitting balance-Leahy Scale: Fair Sitting balance - Comments: appears to weight-shift to R side to decrease weight in L hip for relief, steady and able to upright self in sitting Postural control: Right lateral lean Standing balance support: During functional activity;Bilateral upper extremity supported Standing balance-Leahy Scale: Poor Standing balance comment: impulsive/unsafe, poor weight-shifting to LLE and ditches RW for therapist arms                    Pertinent Vitals/Pain Pain Assessment: Faces Faces Pain Scale: Hurts little more Pain Location: L hip Pain Descriptors / Indicators: Guarding;Grimacing Pain Intervention(s): Limited activity within patient's tolerance;Monitored during session;Repositioned    Home Living Family/patient expects to be discharged to:: Skilled nursing facility  Prior Function Level of Independence: Needs assistance   Gait / Transfers Assistance Needed: Per chart review, pt resides in NH in memory care unit, was ambulating with RW  ADL's / Homemaking Assistance  Needed: Per chart review, pt resides in NH in memory care unit  Comments: Pt unable to answer questions regarding PLOF, baseline dementia, able to follow one step commands.     Hand Dominance        Extremity/Trunk Assessment   Upper Extremity Assessment Upper Extremity Assessment: Overall WFL for tasks assessed    Lower Extremity Assessment Lower Extremity Assessment: Difficult to assess due to impaired cognition    Cervical / Trunk Assessment Cervical / Trunk Assessment: Normal  Communication   Communication: (difficult to understand, occasional mumbling responses, baseline dementia)  Cognition Arousal/Alertness: Awake/alert Behavior During Therapy: Restless Overall Cognitive Status: History of cognitive impairments - at baseline                                 General Comments: Pt able to follow one step commands, repeats "back up" if therapist attempts to assist pt with mobility, restless and mildly agitated but not combative.      General Comments      Exercises     Assessment/Plan    PT Assessment Patient needs continued PT services  PT Problem List Decreased strength;Decreased range of motion;Decreased activity tolerance;Decreased balance;Decreased mobility;Decreased cognition;Decreased safety awareness;Pain       PT Treatment Interventions DME instruction;Gait training;Functional mobility training;Therapeutic activities;Therapeutic exercise;Balance training;Neuromuscular re-education;Patient/family education;Wheelchair mobility training;Manual techniques;Modalities    PT Goals (Current goals can be found in the Care Plan section)  Acute Rehab PT Goals Patient Stated Goal: none stated PT Goal Formulation: With patient Time For Goal Achievement: 09/11/19 Potential to Achieve Goals: Good    Frequency Min 2X/week   Barriers to discharge        Co-evaluation               AM-PAC PT "6 Clicks" Mobility  Outcome Measure Help needed  turning from your back to your side while in a flat bed without using bedrails?: A Little Help needed moving from lying on your back to sitting on the side of a flat bed without using bedrails?: A Little Help needed moving to and from a bed to a chair (including a wheelchair)?: A Lot Help needed standing up from a chair using your arms (e.g., wheelchair or bedside chair)?: A Little Help needed to walk in hospital room?: A Lot Help needed climbing 3-5 steps with a railing? : Total 6 Click Score: 14    End of Session Equipment Utilized During Treatment: Gait belt Activity Tolerance: Patient limited by pain Patient left: in chair;with call bell/phone within reach;with chair alarm set;with nursing/sitter in room Nurse Communication: Mobility status PT Visit Diagnosis: Unsteadiness on feet (R26.81);Muscle weakness (generalized) (M62.81);History of falling (Z91.81)    Time: 9147-8295 PT Time Calculation (min) (ACUTE ONLY): 11 min   Charges:   PT Evaluation $PT Eval Low Complexity: 1 Low          Tori Haylee Mcanany PT, DPT 08/28/19, 11:37 AM [phone]

## 2019-08-29 ENCOUNTER — Encounter: Payer: Self-pay | Admitting: *Deleted

## 2019-08-29 LAB — CBC
HCT: 22.1 % — ABNORMAL LOW (ref 36.0–46.0)
Hemoglobin: 7.4 g/dL — ABNORMAL LOW (ref 12.0–15.0)
MCH: 36.3 pg — ABNORMAL HIGH (ref 26.0–34.0)
MCHC: 33.5 g/dL (ref 30.0–36.0)
MCV: 108.3 fL — ABNORMAL HIGH (ref 80.0–100.0)
Platelets: 144 10*3/uL — ABNORMAL LOW (ref 150–400)
RBC: 2.04 MIL/uL — ABNORMAL LOW (ref 3.87–5.11)
RDW: 14.2 % (ref 11.5–15.5)
WBC: 8.8 10*3/uL (ref 4.0–10.5)
nRBC: 0 % (ref 0.0–0.2)

## 2019-08-29 LAB — BASIC METABOLIC PANEL
Anion gap: 7 (ref 5–15)
BUN: 26 mg/dL — ABNORMAL HIGH (ref 8–23)
CO2: 27 mmol/L (ref 22–32)
Calcium: 8.4 mg/dL — ABNORMAL LOW (ref 8.9–10.3)
Chloride: 104 mmol/L (ref 98–111)
Creatinine, Ser: 0.89 mg/dL (ref 0.44–1.00)
GFR calc Af Amer: >60 mL/min (ref >60)
GFR calc non Af Amer: 55 mL/min — ABNORMAL LOW (ref >60)
Glucose, Bld: 98 mg/dL (ref 70–99)
Potassium: 3.7 mmol/L (ref 3.5–5.1)
Sodium: 138 mmol/L (ref 135–145)

## 2019-08-29 NOTE — TOC Progression Note (Signed)
Transition of Care [facility]) - Progression Note    Patient Details  Name: Sheila Ford MRN: [id number] Date of Birth: May 14, 1922  [facility] CM/SW Contact  Geni Bers, RN Phone Number: 08/29/2019, 9:39 AM  Clinical Narrative:     [facility] asked that pt go to [facility] for Rehab before coming back to ALF.       Expected Discharge Plan and Services                                                 Social Determinants of Health (SDOH) Interventions    Readmission Risk Interventions No flowsheet data found.

## 2019-08-29 NOTE — Progress Notes (Signed)
     Subjective: 2 Days Post-Op Procedure(s) (LRB): LEFT INTRAMEDULLARY (IM) NAIL INTERTROCHANTRIC (Left)   Patient resting comfortably in bed.  No reported events throughout the night. Discharge disposition to SNF once stable and arranged.  Objective:   VITALS:   Vitals:   08/28/19 2204 08/29/19 0449  BP: (!) 108/43 132/78  Pulse: 74 60  Resp: 16   Temp:    SpO2:  97%    Dorsiflexion/Plantar flexion intact Incision: C/D/I.  Both dressing have been removed. No cellulitis present Compartment soft  LABS Recent Labs    08/26/19 2022 08/28/19 0403 08/29/19 0513  HGB 10.6* 10.4* 7.4*  HCT 32.1* 32.0* 22.1*  WBC 12.0* 7.0 8.8  PLT 203 129* 144*    Recent Labs    08/26/19 2022 08/28/19 0403 08/29/19 0513  NA 137 140 138  K 3.8 4.6 3.7  BUN 17 20 26*  CREATININE 0.75 0.95 0.89  GLUCOSE 182* 138* 98     Assessment/Plan: 2 Days Post-Op Procedure(s) (LRB): LEFT INTRAMEDULLARY (IM) NAIL INTERTROCHANTRIC (Left)   Up with therapy Discharge to SNF when ready and arranged   Anastasio Auerbach. Inell Mimbs   PAC  08/29/2019, 11:16 AM

## 2019-08-30 LAB — CBC
HCT: 21.2 % — ABNORMAL LOW (ref 36.0–46.0)
HCT: 20.7 % — ABNORMAL LOW (ref 36.0–46.0)
Hemoglobin: 6.8 g/dL — CL (ref 12.0–15.0)
Hemoglobin: 6.7 g/dL — CL (ref 12.0–15.0)
MCH: 35.2 pg — ABNORMAL HIGH (ref 26.0–34.0)
MCH: 35.4 pg — ABNORMAL HIGH (ref 26.0–34.0)
MCHC: 32.1 g/dL (ref 30.0–36.0)
MCHC: 32.4 g/dL (ref 30.0–36.0)
MCV: 109.8 fL — ABNORMAL HIGH (ref 80.0–100.0)
MCV: 109.5 fL — ABNORMAL HIGH (ref 80.0–100.0)
Platelets: 135 10*3/uL — ABNORMAL LOW (ref 150–400)
Platelets: 137 10*3/uL — ABNORMAL LOW (ref 150–400)
RBC: 1.93 MIL/uL — ABNORMAL LOW (ref 3.87–5.11)
RBC: 1.89 MIL/uL — ABNORMAL LOW (ref 3.87–5.11)
RDW: 14.3 % (ref 11.5–15.5)
RDW: 14.4 % (ref 11.5–15.5)
WBC: 6 10*3/uL (ref 4.0–10.5)
WBC: 5.6 10*3/uL (ref 4.0–10.5)
nRBC: 0 % (ref 0.0–0.2)
nRBC: 0 % (ref 0.0–0.2)

## 2019-08-30 LAB — BASIC METABOLIC PANEL
Anion gap: 6 (ref 5–15)
BUN: 21 mg/dL (ref 8–23)
CO2: 26 mmol/L (ref 22–32)
Calcium: 8 mg/dL — ABNORMAL LOW (ref 8.9–10.3)
Chloride: 107 mmol/L (ref 98–111)
Creatinine, Ser: 0.68 mg/dL (ref 0.44–1.00)
GFR calc Af Amer: >60 mL/min (ref >60)
GFR calc non Af Amer: >60 mL/min (ref >60)
Glucose, Bld: 78 mg/dL (ref 70–99)
Potassium: 3.9 mmol/L (ref 3.5–5.1)
Sodium: 139 mmol/L (ref 135–145)

## 2019-08-30 LAB — PREPARE RBC (CROSSMATCH)

## 2019-08-30 MED ORDER — SODIUM CHLORIDE 0.9% IV SOLUTION: Freq: Once | INTRAVENOUS | Status: DC

## 2019-08-30 NOTE — [place] FL2 (Signed)
[place] MEDICAID FL2 LEVEL OF CARE SCREENING TOOL     IDENTIFICATION  Patient Name: Sheila Ford Birthdate: 08-Jul-1922 Sex: female Admission Date (Current Location): 08/26/2019  [facility] and [place] Number:  Herbalist and Address:  [facility],  [place], [place]      Provider Number: [id number]  Attending Physician Name and Address:  Paralee Cancel, MD  Relative Name and Phone Number:  Creta Levin [phone]    Current Level of Care: Hospital Recommended Level of Care: [place] Prior Approval Number:    Date Approved/Denied:   PASRR Number:    Discharge Plan: SNF    Current Diagnoses: Patient Active Problem List   Diagnosis Date Noted  . Closed left hip fracture ([place]) 08/26/2019  . Hip fracture, left, closed, initial encounter ([place]) 08/26/2019  . Routine general medical examination at a health care facility 10/11/2018  . Essential hypertension 10/10/2017  . Dementia ([place]) 10/10/2017    Orientation RESPIRATION BLADDER Height & Weight     Self  Normal Incontinent Weight: 46.3 kg Height:  5' (152.4 cm)  BEHAVIORAL SYMPTOMS/MOOD NEUROLOGICAL BOWEL NUTRITION STATUS      Incontinent Diet(Regular)  AMBULATORY STATUS COMMUNICATION OF NEEDS Skin   Extensive Assist(Right hip fracture) Verbally Other (Comment)(right hip nailing surgery wound)                       Personal Care Assistance Level of Assistance  Bathing, Feeding, Dressing Bathing Assistance: Maximum assistance Feeding assistance: Maximum assistance Dressing Assistance: Maximum assistance     Functional Limitations Info  Sight, Hearing, Speech Sight Info: Adequate Hearing Info: Adequate Speech Info: Adequate    SPECIAL CARE FACTORS FREQUENCY  PT (By licensed PT), OT (By licensed OT)     PT Frequency: Eval and Treat OT Frequency: Eval and Treat            Contractures Contractures Info: Not present    Additional Factors  Info  Code Status, Allergies Code Status Info: FULL Allergies Info: No Known Allergies           Current Medications (08/30/2019):  This is the current hospital active medication list Current Facility-Administered Medications  Medication Dose Route Frequency Provider Last Rate Last Admin  . 0.9 %  sodium chloride infusion (Manually program via Guardrails IV Fluids)   Intravenous Once Babish, Matthew, PA-C      . acetaminophen (TYLENOL) tablet 325-650 mg  325-650 mg Oral Q6H PRN Danae Orleans, PA-C      . amLODipine (NORVASC) tablet 5 mg  5 mg Oral Daily Danae Orleans, PA-C   5 mg at 08/29/19 1043  . aspirin chewable tablet 81 mg  81 mg Oral BID Danae Orleans, PA-C   81 mg at 08/29/19 2333  . bisacodyl (DULCOLAX) suppository 10 mg  10 mg Rectal Daily PRN Danae Orleans, PA-C      . celecoxib (CELEBREX) capsule 200 mg  200 mg Oral BID Danae Orleans, PA-C   200 mg at 08/29/19 2334  . diphenhydrAMINE (BENADRYL) 12.5 MG/5ML elixir 12.5-25 mg  12.5-25 mg Oral Q4H PRN Danae Orleans, PA-C      . divalproex (DEPAKOTE SPRINKLE) capsule 125 mg  125 mg Oral Daily Danae Orleans, PA-C   125 mg at 08/29/19 1043  . docusate sodium (COLACE) capsule 100 mg  100 mg Oral BID Danae Orleans, PA-C   100 mg at 08/29/19 2332  . ferrous sulfate tablet 325 mg  325 mg Oral  TID PC Lanney Gins, PA-C   325 mg at 08/29/19 1043  . guaiFENesin (ROBITUSSIN) 100 MG/5ML solution 200 mg  10 mL Oral Q4H PRN Babish, Matthew, PA-C      . magnesium citrate solution 1 Bottle  1 Bottle Oral Once PRN Babish, Matthew, PA-C      . menthol-cetylpyridinium (CEPACOL) lozenge 3 mg  1 lozenge Oral PRN Lanney Gins, PA-C       Or  . phenol (CHLORASEPTIC) mouth spray 1 spray  1 spray Mouth/Throat PRN Lanney Gins, PA-C      . methocarbamol (ROBAXIN) tablet 500 mg  500 mg Oral Q6H PRN Lanney Gins, PA-C   500 mg at 08/28/19 1632   Or  . methocarbamol (ROBAXIN) 500 mg in dextrose 5 % 50 mL IVPB  500 mg Intravenous  Q6H PRN Babish, Matthew, PA-C      . metoCLOPramide (REGLAN) tablet 5-10 mg  5-10 mg Oral Q8H PRN Lanney Gins, PA-C       Or  . metoCLOPramide (REGLAN) injection 5-10 mg  5-10 mg Intravenous Q8H PRN Babish, Matthew, PA-C      . morphine 2 MG/ML injection 0.5-1 mg  0.5-1 mg Intravenous Q2H PRN Babish, Molli Hazard, PA-C      . ondansetron (ZOFRAN) tablet 4 mg  4 mg Oral Q6H PRN Lanney Gins, PA-C       Or  . ondansetron (ZOFRAN) injection 4 mg  4 mg Intravenous Q6H PRN Babish, Matthew, PA-C      . polyethylene glycol (MIRALAX / GLYCOLAX) packet 17 g  17 g Oral Daily PRN Babish, Matthew, PA-C      . traMADol Janean Sark) tablet 50-100 mg  50-100 mg Oral Q8H PRN Babish, Matthew, PA-C   50 mg at 08/28/19 2142     Discharge Medications: Please see discharge summary for a list of discharge medications.  Relevant Imaging Results:  Relevant Lab Results:   Additional Information IO#[id number]  Geni Bers, RN

## 2019-08-30 NOTE — Progress Notes (Signed)
PT Cancellation Note  Patient Details Name: Laveda Africa MRN: [id number] DOB: [birth year]   Cancelled Treatment:    Reason Eval/Treat Not Completed: Medical issues which prohibited therapy - Pt with Hgb 6.7, to receive blood this am. PT to check back as schedule allows.  Richrd Sox, PT Acute Rehabilitation Services Pager [phone]  Office [phone]    Tyrone Apple D Despina Hidden 08/30/2019, 10:24 AM

## 2019-08-30 NOTE — Progress Notes (Signed)
     Subjective: 3 Days Post-Op Procedure(s) (LRB): LEFT INTRAMEDULLARY (IM) NAIL INTERTROCHANTRIC (Left)   Seen by Dr. Charlann Boxer. Patientresting comfortably in bed. No reported events throughout the night, low H&H reported this AM.  Plan on receiving 2 units of blood this morning.  Discharge disposition to SNF once stable and arranged.   Objective:   VITALS:   Vitals:   08/29/19 2118 08/30/19 0547  BP: 116/77 126/74  Pulse: 67 62  Resp: 16 16  Temp: 97.9 F (36.6 C) 97.9 F (36.6 C)  SpO2: 90% 91%    Dorsiflexion/Plantar flexion intact Incision: C/D/I, dressing removed No cellulitis present Compartment soft  LABS Recent Labs    08/29/19 0513 08/30/19 0416 08/30/19 0627  HGB 7.4* 6.8* 6.7*  HCT 22.1* 21.2* 20.7*  WBC 8.8 6.0 5.6  PLT 144* 135* 137*    Recent Labs    08/28/19 0403 08/29/19 0513 08/30/19 0416  NA 140 138 139  K 4.6 3.7 3.9  BUN 20 26* 21  CREATININE 0.95 0.89 0.68  GLUCOSE 138* 98 78     Assessment/Plan: 3 Days Post-Op Procedure(s) (LRB): LEFT INTRAMEDULLARY (IM) NAIL INTERTROCHANTRIC (Left) Up with therapy Discharge to SNF when arranged and stable   ABLA  To receive units of blood today Treated with iron and will observe      Anastasio Auerbach. Markanthony Gedney   PAC  08/30/2019, 11:20 AM

## 2019-08-30 NOTE — Progress Notes (Signed)
Notified by lab of a critical HGB of 6.8. MD paged. Awaiting new orders.

## 2019-08-30 NOTE — Progress Notes (Signed)
Patient IV beeping RN went to checked and found patient IV already out and laying on the table. Patient is getting her 2nd unit of blood. We will notify on call provider.

## 2019-08-30 NOTE — Progress Notes (Signed)
Got a call back from provider Herbert Pun to hold off for anymore blood transfusion tonight and check on labs in the morning. We will continue to monitor.

## 2019-08-30 NOTE — TOC Progression Note (Signed)
Transition of Care [facility]) - Progression Note    Patient Details  Name: Sheila Ford MRN: [id number] Date of Birth: [birth year]  [facility]) CM/SW Contact  Geni Bers, RN Phone Number: 08/30/2019, 4:20 PM  Clinical Narrative:    PASRR [phone] A. FL2  Faxed.         Expected Discharge Plan and Services                                                 Social Determinants of Health (SDOH) Interventions    Readmission Risk Interventions No flowsheet data found.

## 2019-08-30 NOTE — Progress Notes (Signed)
Received orders for STAT repeat CBC.

## 2019-08-31 LAB — CBC
HCT: 38.6 % (ref 36.0–46.0)
Hemoglobin: 13 g/dL (ref 12.0–15.0)
MCH: 33.9 pg (ref 26.0–34.0)
MCHC: 33.7 g/dL (ref 30.0–36.0)
MCV: 100.5 fL — ABNORMAL HIGH (ref 80.0–100.0)
Platelets: 150 10*3/uL (ref 150–400)
RBC: 3.84 MIL/uL — ABNORMAL LOW (ref 3.87–5.11)
RDW: 17.3 % — ABNORMAL HIGH (ref 11.5–15.5)
WBC: 8.7 10*3/uL (ref 4.0–10.5)
nRBC: 0.2 % (ref 0.0–0.2)

## 2019-08-31 MED ORDER — POLYETHYLENE GLYCOL 3350 17 G PO PACK: 17.0000 g | PACK | Freq: Every day | ORAL | 0 refills | Status: AC | PRN

## 2019-08-31 NOTE — Progress Notes (Addendum)
   Subjective: 4 Days Post-Op Procedure(s) (LRB): LEFT INTRAMEDULLARY (IM) NAIL INTERTROCHANTRIC (Left) Patient reports pain as mild.   Patient seen in rounds for Dr. Charlann Boxer. Patient is well, and has had no acute complaints or problems. Resting comfortably in bed. Plan is to go Skilled nursing facility after hospital stay.  Objective: Vital signs in last 24 hours: Temp:  [97.3 F (36.3 C)-98.2 F (36.8 C)] 98.2 F (36.8 C) (01/15 2122) Pulse Rate:  [56-74] 65 (01/16 0449) Resp:  [14-18] 16 (01/16 0449) BP: (115-168)/(70-115) 168/99 (01/16 0449) SpO2:  [94 %-100 %] 100 % (01/16 0449)  Intake/Output from previous day:  Intake/Output Summary (Last 24 hours) at 08/31/2019 0818 Last data filed at 08/31/2019 0511 Gross per 24 hour  Intake 952 ml  Output 1400 ml  Net -448 ml    Intake/Output this shift: No intake/output data recorded.  Labs: Recent Labs    08/29/19 0513 08/30/19 0416 08/30/19 0627 08/31/19 0701  HGB 7.4* 6.8* 6.7* 13.0   Recent Labs    08/30/19 0627 08/31/19 0701  WBC 5.6 8.7  RBC 1.89* 3.84*  HCT 20.7* 38.6  PLT 137* 150   Recent Labs    08/29/19 0513 08/30/19 0416  NA 138 139  K 3.7 3.9  CL 104 107  CO2 27 26  BUN 26* 21  CREATININE 0.89 0.68  GLUCOSE 98 78  CALCIUM 8.4* 8.0*   Exam: General - Patient is Alert Extremity - Neurologically intact Neurovascular intact Sensation intact distally Dorsiflexion/Plantar flexion intact Motor Function - intact, moving foot and toes well on exam.   Past Medical History:  Diagnosis Date  . Glaucoma   . Hypertension     Assessment/Plan: 4 Days Post-Op Procedure(s) (LRB): LEFT INTRAMEDULLARY (IM) NAIL INTERTROCHANTRIC (Left) Active Problems:   Closed left hip fracture (HCC)   Hip fracture, left, closed, initial encounter (HCC)  Estimated body mass index is 19.93 kg/m as calculated from the following:   Height as of this encounter: 5' (1.524 m).   Weight as of this encounter: 46.3 kg. Up  with therapy  DVT Prophylaxis - Aspirin Weight-bearing as tolerated  Hemoglobin improved at 13.0 this AM. Awaiting SNF placement, continue working with therapy.  Arther Abbott, PA-C Orthopedic Surgery 08/31/2019, 8:18 AM

## 2019-08-31 NOTE — Progress Notes (Signed)
Physical Therapy Treatment Patient Details Name: Sheila Ford MRN: [id number] DOB: 12/03/1921 Today's Date: 08/31/2019    History of Present Illness 86 year old female with a history of dementia who presented to the ED via EMS with complaints of left hip pain after a fall on 08/26/19. She reports tripping over her walker, and falling onto her back and left side. She denies LOC or other injury. She resides in an extended memory care facility. She is not taking any blood thinners.  She was evaluated in the [facility] ED, and found to have a left femoral intertrochanteric fracture by x-ray. s/p IMN on 08/27/19.    PT Comments    Bed level ROM exercises on today. Pt is restless/fidgety-trying to remove mittens. She follows 1 step commands inconsistently. Will continue to follow. Continue to recommend SNF.   Follow Up Recommendations  SNF     Equipment Recommendations  None recommended by PT    Recommendations for Other Services       Precautions / Restrictions Precautions Precautions: Fall Restrictions Weight Bearing Restrictions: No RLE Weight Bearing: Weight bearing as tolerated    Mobility  Bed Mobility                  Transfers                    Ambulation/Gait                 Stairs             Wheelchair Mobility    Modified [place] (Stroke Patients Only)       Balance                                            Cognition Arousal/Alertness: Awake/alert Behavior During Therapy: Restless Overall Cognitive Status: History of cognitive impairments - at baseline                                        Exercises General Exercises - Lower Extremity Ankle Circles/Pumps: AAROM;Both;10 reps;Supine Short Arc Quad: AAROM;Left;10 reps;Supine Heel Slides: AAROM;Left;10 reps;Supine Hip ABduction/ADduction: AAROM;Left;10 reps;Supine    General Comments        Pertinent Vitals/Pain Pain Assessment:  Faces Faces Pain Scale: Hurts a little bit Pain Location: L hip Pain Descriptors / Indicators: Grimacing Pain Intervention(s): Monitored during session    Home Living                      Prior Function            PT Goals (current goals can now be found in the care plan section) Progress towards PT goals: Progressing toward goals    Frequency    Min 2X/week      PT Plan Current plan remains appropriate    Co-evaluation              AM-PAC PT "6 Clicks" Mobility   Outcome Measure  Help needed turning from your back to your side while in a flat bed without using bedrails?: A Little Help needed moving from lying on your back to sitting on the side of a flat bed without using bedrails?: A Little Help needed moving to and from a bed to a  chair (including a wheelchair)?: A Lot Help needed standing up from a chair using your arms (e.g., wheelchair or bedside chair)?: A Lot Help needed to walk in hospital room?: Total Help needed climbing 3-5 steps with a railing? : Total 6 Click Score: 12    End of Session   Activity Tolerance: Patient tolerated treatment well Patient left: in bed;with call bell/phone within reach;with bed alarm set(with mittens on; telesitter in room)   PT Visit Diagnosis: Muscle weakness (generalized) (M62.81);History of falling (Z91.81);Difficulty in walking, not elsewhere classified (R26.2)     Time: 1638-4665 PT Time Calculation (min) (ACUTE ONLY): 8 min  Charges:  $Therapeutic Exercise: 8-22 mins                         Faye Ramsay, PT Acute Rehabilitation

## 2019-09-01 NOTE — Progress Notes (Signed)
During assessment of patient, left hand was swollen. Pulse was not palpable. Immediately took off wrap around the IV. Re-assessed and pulse was palpable. Glynis Smiles, PA notified regarding event. No new orders at this time. Will continue to monitor closely.

## 2019-09-01 NOTE — Progress Notes (Signed)
Subjective: 5 Days Post-Op Procedure(s) (LRB): LEFT INTRAMEDULLARY (IM) NAIL INTERTROCHANTRIC (Left) Patient seen in rounds for Dr. Charlann Boxer. Patient with baseline dementia, but resting comfortable in bed. Reports pain as mild.  No acute events over night.   Objective: Vital signs in last 24 hours: Temp:  [98.1 F (36.7 C)-98.7 F (37.1 C)] 98.3 F (36.8 C) (01/17 0623) Pulse Rate:  [51-82] 51 (01/17 0623) Resp:  [16-21] 16 (01/17 0623) BP: (105-155)/(63-73) 105/63 (01/17 0623) SpO2:  [97 %-98 %] 98 % (01/17 0623)  Intake/Output from previous day: 01/16 0701 - 01/17 0700 In: 420 [P.O.:420] Out: 250 [Urine:250] Intake/Output this shift: No intake/output data recorded.  Recent Labs    08/30/19 0416 08/30/19 0627 08/31/19 0701  HGB 6.8* 6.7* 13.0   Recent Labs    08/30/19 0627 08/31/19 0701  WBC 5.6 8.7  RBC 1.89* 3.84*  HCT 20.7* 38.6  PLT 137* 150   Recent Labs    08/30/19 0416  NA 139  K 3.9  CL 107  CO2 26  BUN 21  CREATININE 0.68  GLUCOSE 78  CALCIUM 8.0*   No results for input(s): LABPT, INR in the last 72 hours.  ABD soft Neurovascular intact Sensation intact distally Intact pulses distally Dorsiflexion/Plantar flexion intact Incision: no dressing. Glue in place. No discharge or drainage.  No cellulitis present Compartment soft. Calves non-tender. Negative Homan's.     Assessment/Plan: 5 Days Post-Op Procedure(s) (LRB): LEFT INTRAMEDULLARY (IM) NAIL INTERTROCHANTRIC (Left)  Up with therapy  DVT Prophylaxis - Aspirin Weight-bearing as tolerated  Awaiting SNF placement, continue working with therapy.   Sheila Ford 09/01/2019, 8:14 AM

## 2019-09-02 LAB — TYPE AND SCREEN
ABO/RH(D): O POS
Antibody Screen: NEGATIVE
Unit division: 0
Unit division: 0

## 2019-09-02 LAB — BPAM RBC
Blood Product Expiration Date: 202102102359
Blood Product Expiration Date: 202102122359
ISSUE DATE / TIME: 202101151136
ISSUE DATE / TIME: 202101151727
Unit Type and Rh: 5100
Unit Type and Rh: 5100

## 2019-09-02 LAB — SARS CORONAVIRUS 2 (TAT 6-24 HRS): SARS Coronavirus 2: NEGATIVE

## 2019-09-02 NOTE — Progress Notes (Signed)
Patient's hand has been getting checked closely/re-assessed. Swelling has come down a lot. Patient's pulses are palpable. Continuing to monitor patient.

## 2019-09-02 NOTE — Progress Notes (Signed)
Writer bladder scan patient. Patient hasn't voided. Bladder scan . Writer paged PA Lanney Gins. PA ordered to in and out cath patient. Writer went to in & out cath patient. Patient had two episodes of incontinence of urine.

## 2019-09-02 NOTE — TOC Initial Note (Addendum)
Transition of Care [facility]) - Initial/Assessment Note    Patient Details  Name: Sheila Ford MRN: [id number] Date of Birth: July 26, 1922  [facility]) CM/SW Contact:    Leeroy Cha, RN Phone Number: 09/02/2019, 3:01 PM  Clinical Narrative:                 PATIENT IS A RESIDENT AT [facility] on [address] here in [place].  Needs SNF for further care,tct-daufghter to see which snf they would preferr from the list of accepted bed offers. TCF-Daughter/thorne/would like to use [facility]. Expected Discharge Plan: Skilled Nursing Facility Barriers to Discharge: SNF Pending bed offer   Patient Goals and CMS Choice Patient states their goals for this hospitalization and ongoing recovery are:: NOT STATED CMS Medicare.gov Compare Post Acute Care list provided to:: Patient Represenative (must comment)(DAUGHTER) Choice offered to / list presented to : Adult Children  Expected Discharge Plan and Services Expected Discharge Plan: [place]   Discharge Planning Services: CM Consult   Living arrangements for the past 2 months: [place]                                      Prior Living Arrangements/Services Living arrangements for the past 2 months: [place]   Patient language and need for interpreter reviewed:: No        Need for Family Participation in Patient Care: Yes (Comment)     Criminal Activity/Legal Involvement Pertinent to Current Situation/Hospitalization: No - Comment as needed  Activities of Daily Living Home Assistive Devices/Equipment: Walker (specify type) ADL Screening (condition at time of admission) Patient's cognitive ability adequate to safely complete daily activities?: No Is the patient deaf or have difficulty hearing?: No Does the patient have difficulty seeing, even when wearing glasses/contacts?: No Does the patient have difficulty concentrating, remembering, or making decisions?:  Yes Patient able to express need for assistance with ADLs?: No Does the patient have difficulty dressing or bathing?: Yes Independently performs ADLs?: No Communication: Independent Dressing (OT): Dependent Is this a change from baseline?: Pre-admission baseline Grooming: Dependent Is this a change from baseline?: Pre-admission baseline Feeding: Needs assistance Is this a change from baseline?: Pre-admission baseline Bathing: Dependent Is this a change from baseline?: Pre-admission baseline Toileting: Dependent Is this a change from baseline?: Pre-admission baseline In/Out Bed: Dependent Is this a change from baseline?: Pre-admission baseline Walks in Home: Needs assistance Is this a change from baseline?: Pre-admission baseline Does the patient have difficulty walking or climbing stairs?: Yes Weakness of Legs: Both Weakness of Arms/Hands: None  Permission Sought/Granted                  Emotional Assessment Appearance:: Appears stated age     Orientation: : Fluctuating Orientation (Suspected and/or reported Sundowners) Alcohol / Substance Use: Not Applicable Psych Involvement: No (comment)  Admission diagnosis:  Closed left hip fracture ([place]) [S72.002A] Fall from slip, trip, or stumble, initial encounter [W01.0XXA] Hip fracture, left, closed, initial encounter ([place]) [S72.002A] Closed 2-part intertrochanteric fracture of left femur, initial encounter [facility]) [S72.142A] Patient Active Problem List   Diagnosis Date Noted  . Closed left hip fracture ([place]) 08/26/2019  . Hip fracture, left, closed, initial encounter ([place]) 08/26/2019  . Routine general medical examination at a health care facility 10/11/2018  . Essential hypertension 10/10/2017  . Dementia ([place]) 10/10/2017   PCP:  Sheila Koch, MD  Pharmacy:   [id number] [place], [place] - [address] [place] [address] Phone: [phone] Fax: [phone]     Social  Determinants of Health (SDOH) Interventions    Readmission Risk Interventions No flowsheet data found.

## 2019-09-02 NOTE — Progress Notes (Signed)
     Subjective: 6 Days Post-Op Procedure(s) (LRB): LEFT INTRAMEDULLARY (IM) NAIL INTERTROCHANTRIC (Left)    Patientresting comfortably in bed. Reported incident last night of the IV wrapping around her hand and swelling up.  Nurse was able to find a pulse, and the hand returned to normal size and function. Discharge dispositionto SNF once stable and arranged.  COVID test ordered with anticipation for d/c.    Objective:   VITALS:   Vitals:   09/01/19 2032 09/02/19 0538  BP: 135/67 129/74  Pulse: 61 75  Resp: 18 18  Temp: 97.9 F (36.6 C) 98.3 F (36.8 C)  SpO2: 98% 100%   Hand is of normal size and function Dorsiflexion/Plantar flexion intact Incision: dressing C/D/I No cellulitis present Compartment soft  LABS Recent Labs    08/31/19 0701  HGB 13.0  HCT 38.6  WBC 8.7  PLT 150      Assessment/Plan: 6 Days Post-Op Procedure(s) (LRB): LEFT INTRAMEDULLARY (IM) NAIL INTERTROCHANTRIC (Left)  Up with therapy Discharge to SNF when arranged and stable COVID test ordered for SNF placement    Anastasio Auerbach. Zuhair Lariccia   PAC  09/02/2019, 7:41 AM

## 2019-09-03 NOTE — Progress Notes (Signed)
Patient ID: Sheila Ford, female   DOB: [birth year], 86 y.o.   MRN: [id number] Subjective: 7 Days Post-Op Procedure(s) (LRB): LEFT INTRAMEDULLARY (IM) NAIL INTERTROCHANTRIC (Left)    Patient demented.  Unable to relate ay concerns.  No reported events.  Objective:   VITALS:   Vitals:   09/02/19 2123 09/03/19 0510  BP: 138/72 137/74  Pulse: 75 64  Resp: 16 18  Temp: 98.4 F (36.9 C) 98.4 F (36.9 C)  SpO2: 98% 98%    Incision: no drainage  Due to her dementia no dressing has been able to be maintained. Peri-op surgical glue present over wounds No significant swelling  LABS No results for input(s): HGB, HCT, WBC, PLT in the last 72 hours.  No results for input(s): NA, K, BUN, CREATININE, GLUCOSE in the last 72 hours.  No results for input(s): LABPT, INR in the last 72 hours.   Assessment/Plan: 7 Days Post-Op Procedure(s) (LRB): LEFT INTRAMEDULLARY (IM) NAIL INTERTROCHANTRIC (Left)   Up with therapy Discharge to SNF when available COVID screening test performed

## 2019-09-03 NOTE — Care Management Important Message (Signed)
Important Message  Patient Details IM Letter given to Marcelle Smiling RN Case Manager to present to the Patient Name: Germany Vermette MRN: [id number] Date of Birth: [birth year]   Medicare Important Message Given:  Yes     Caren Macadam 09/03/2019, 2:35 PM

## 2019-09-03 NOTE — TOC Progression Note (Addendum)
Transition of Care [facility]) - Progression Note    Patient Details  Name: Keondra Mcwherter MRN: [id number] Date of Birth: February 10, 1922  [facility]) CM/SW Contact  Golda Acre, RN Phone Number: 09/03/2019, 12:16 PM  Clinical Narrative:    tct-daughter Marilyn/offered [facility] to daughter/does not want her mother going to [place].[facility] no bed until end of week. tct-[place] Jola Babinski in formed of this will allow p[atient to to go [place]. TCT-Kelly comer at [facility] left for her to return call. TCF-Kelly patient will need new covid test-message lsent to Dr. Charlann Boxer to order today/has beed for [id number].  Expected Discharge Plan: Skilled Nursing Facility Barriers to Discharge: SNF Pending bed offer  Expected Discharge Plan and Services Expected Discharge Plan: Skilled Nursing Facility   Discharge Planning Services: CM Consult   Living arrangements for the past 2 months: Assisted Living Facility                                       Social Determinants of Health (SDOH) Interventions    Readmission Risk Interventions No flowsheet data found.

## 2019-09-04 DIAGNOSIS — U071 COVID-19: Secondary | ICD-10-CM | POA: Diagnosis not present

## 2019-09-04 DIAGNOSIS — I10 Essential (primary) hypertension: Secondary | ICD-10-CM | POA: Diagnosis not present

## 2019-09-04 DIAGNOSIS — H409 Unspecified glaucoma: Secondary | ICD-10-CM | POA: Diagnosis not present

## 2019-09-04 DIAGNOSIS — S72142D Displaced intertrochanteric fracture of left femur, subsequent encounter for closed fracture with routine healing: Secondary | ICD-10-CM | POA: Diagnosis not present

## 2019-09-04 DIAGNOSIS — R5381 Other malaise: Secondary | ICD-10-CM | POA: Diagnosis not present

## 2019-09-04 DIAGNOSIS — Z7401 Bed confinement status: Secondary | ICD-10-CM | POA: Diagnosis not present

## 2019-09-04 DIAGNOSIS — R262 Difficulty in walking, not elsewhere classified: Secondary | ICD-10-CM | POA: Diagnosis not present

## 2019-09-04 DIAGNOSIS — F0390 Unspecified dementia without behavioral disturbance: Secondary | ICD-10-CM | POA: Diagnosis not present

## 2019-09-04 DIAGNOSIS — M2550 Pain in unspecified joint: Secondary | ICD-10-CM | POA: Diagnosis not present

## 2019-09-04 DIAGNOSIS — Z4789 Encounter for other orthopedic aftercare: Secondary | ICD-10-CM | POA: Diagnosis not present

## 2019-09-04 DIAGNOSIS — D649 Anemia, unspecified: Secondary | ICD-10-CM | POA: Diagnosis not present

## 2019-09-04 DIAGNOSIS — M6281 Muscle weakness (generalized): Secondary | ICD-10-CM | POA: Diagnosis not present

## 2019-09-04 DIAGNOSIS — Z9181 History of falling: Secondary | ICD-10-CM | POA: Diagnosis not present

## 2019-09-04 DIAGNOSIS — S72142A Displaced intertrochanteric fracture of left femur, initial encounter for closed fracture: Secondary | ICD-10-CM | POA: Diagnosis not present

## 2019-09-04 DIAGNOSIS — R2681 Unsteadiness on feet: Secondary | ICD-10-CM | POA: Diagnosis not present

## 2019-09-04 NOTE — TOC Transition Note (Signed)
Transition of Care [facility]) - CM/SW Discharge Note   Patient Details  Name: Sherrel Covino MRN: [id number] Date of Birth: 1922/08/10  Transition [facility]) CM/SW Contact:  Clearance Coots, LCSW Phone Number: 09/04/2019, 10:17 AM   Clinical Narrative:    SNF-[facility] will admit the patient today. Per SNF, the negative COVID-19 test 1/18 is sufficient, the patient has not had any fevers.  Patient daughter Jola Babinski notified. She has requested to bring her mother belongings before she discharges.  [facility] to transport.  Nurse call report: [phone] Room 3B    Final next level of care: Skilled Nursing Facility Barriers to Discharge: Barriers Resolved   Patient Goals and CMS Choice Patient states their goals for this hospitalization and ongoing recovery are:: NOT STATED CMS Medicare.gov Compare Post Acute Care list provided to:: Patient Represenative (must comment)(DAUGHTER) Choice offered to / list presented to : Adult Children  Discharge Placement              Patient chooses bed at: [facility] Patient to be transferred to facility by: [facility]-facility preference Name of family member notified: Daughter Jola Babinski Patient and family notified of of transfer: 09/04/19  Discharge Plan and Services   Discharge Planning Services: CM Consult                                 Social Determinants of Health (SDOH) Interventions     Readmission Risk Interventions No flowsheet data found.

## 2019-09-04 NOTE — Plan of Care (Signed)
  Problem: Education: Goal: Verbalization of understanding the information provided (i.e., activity precautions, restrictions, etc) will improve Outcome: Adequate for Discharge Goal: Individualized Educational Video(s) Outcome: Adequate for Discharge   Problem: Activity: Goal: Ability to ambulate and perform ADLs will improve Outcome: Adequate for Discharge   Problem: Clinical Measurements: Goal: Postoperative complications will be avoided or minimized Outcome: Adequate for Discharge   Problem: Self-Concept: Goal: Ability to maintain and perform role responsibilities to the fullest extent possible will improve Outcome: Adequate for Discharge   Problem: Pain Management: Goal: Pain level will decrease Outcome: Adequate for Discharge   Problem: Acute Rehab PT Goals(only PT should resolve) Goal: Pt Will Go Supine/Side To Sit Outcome: Adequate for Discharge Goal: Pt Will Go Sit To Supine/Side Outcome: Adequate for Discharge Goal: Patient Will Transfer Sit To/From Stand Outcome: Adequate for Discharge Goal: Pt Will Transfer Bed To Chair/Chair To Bed Outcome: Adequate for Discharge Goal: Pt Will Ambulate Outcome: Adequate for Discharge

## 2019-09-04 NOTE — Progress Notes (Signed)
     Subjective: 8 Days Post-Op Procedure(s) (LRB): LEFT INTRAMEDULLARY (IM) NAIL INTERTROCHANTRIC (Left)   Patient  pleasantly demented.  Unable to relate ay concerns.  No reported events.  Objective:   VITALS:   Vitals:   09/03/19 2038 09/04/19 0600  BP: 130/77 (!) 156/89  Pulse: 64 (!) 57  Resp: 18 16  Temp: 98.3 F (36.8 C) 98.3 F (36.8 C)  SpO2: 96% 98%    Incision: no drainage  Due to her dementia no dressing has been able to be maintained. Peri-op surgical glue present over wounds No significant swelling  LABS No results for input(s): HGB, HCT, WBC, PLT in the last 72 hours.  No results for input(s): NA, K, BUN, CREATININE, GLUCOSE in the last 72 hours.   Assessment/Plan: 8 Days Post-Op Procedure(s) (LRB): LEFT INTRAMEDULLARY (IM) NAIL INTERTROCHANTRIC (Left)  Up with therapy Discharge to SNF when available/ready New COVID test ordered     Anastasio Auerbach. Lillee Mooneyhan   PAC  09/04/2019, 8:44 AM

## 2019-09-04 NOTE — Discharge Summary (Signed)
Physician Discharge Summary  Patient ID: Sheila Ford MRN: [id number] DOB/AGE: 17-Apr-1922 86 y.o.  Admit date: 08/26/2019 Discharge date:  09/04/2019  Procedures:  Procedure(s) (LRB): LEFT INTRAMEDULLARY (IM) NAIL INTERTROCHANTRIC (Left)  Attending Physician:  Dr. Durene Romans   Admission Diagnoses:   Left hip fracture  Discharge Diagnoses:  Active Problems:   Closed left hip fracture (HCC)   Hip fracture, left, closed, initial encounter [facility])  Past Medical History:  Diagnosis Date  . Glaucoma   . Hypertension     HPI:    Ms. Sheila Ford is a 86 year old female with a history of dementia who presented to the ED via EMS with complaints of left hip pain after a fall on 08/26/19. She reports tripping over her walker, and falling onto her back and left side. She denies LOC or other injury. She resides in an extended memory care facility. She is not taking any blood thinners. She was evaluated in the [facility] ED, and found to have a left femoral intertrochanteric fracture by x-ray. Dr. Charlann Boxer was consulted for orthopaedic management.   PCP: Myrlene Broker, MD   Discharged Condition: good  Hospital Course:  Patient was admitted to the hospital on 08/26/2019.  Patient underwent the above stated procedure on 08/27/2019. Patient tolerated the procedure well and brought to the recovery room in good condition and subsequently to the floor.  Patient had an uneventful course in the hospital other than receiving blood for low hemoglobin.  Patient was felt to be doing well to be discharged hto SNF.    Discharge Exam: General appearance: no distress Extremities: Homans sign is negative, no sign of DVT, no edema, redness or tenderness in the calves or thighs and no ulcers, gangrene or trophic changes  Disposition:  Skilled nursing facility with follow up in 2 weeks   Contact information for after-discharge care    Destination    [facility] Preferred SNF .   Service:  Skilled Nursing Contact information: [address] [place] [address] [phone]                Allergies as of 09/04/2019   No Known Allergies     Medication List    TAKE these medications   acetaminophen 500 MG tablet Commonly known as: TYLENOL Take 2 tablets (1,000 mg total) by mouth every 8 (eight) hours. What changed:   how much to take  when to take this  reasons to take this   alum & mag hydroxide-simeth 400-400-40 MG/5ML suspension Commonly known as: MAALOX PLUS Take 15 mLs by mouth every 6 (six) hours as needed for indigestion.   amLODipine 5 MG tablet Commonly known as: NORVASC TAKE 2 TABLETS BY MOUTH EVERY DAY What changed: how much to take   aspirin 81 MG chewable tablet Commonly known as: Aspirin Childrens Chew 1 tablet (81 mg total) by mouth 2 (two) times daily. Take for 4 weeks, then resume regular dose.   divalproex 125 MG capsule Commonly known as: DEPAKOTE SPRINKLE Take 125 mg by mouth daily.   guaiFENesin 100 MG/5ML Soln Commonly known as: ROBITUSSIN Take 10 mLs by mouth every 4 (four) hours as needed for cough or to loosen phlegm.   loperamide 2 MG capsule Commonly known as: IMODIUM Take 2 mg by mouth as needed for diarrhea or loose stools.   magnesium hydroxide 400 MG/5ML suspension Commonly known as: MILK OF MAGNESIA Take 30 mLs by mouth at bedtime as needed for mild constipation.  methocarbamol 500 MG tablet Commonly known as: Robaxin Take 1 tablet (500 mg total) by mouth every 6 (six) hours as needed for muscle spasms.   neomycin-bacitracin-polymyxin ointment Commonly known as: NEOSPORIN Apply 1 application topically as needed for wound care.   polyethylene glycol 17 g packet Commonly known as: MIRALAX / GLYCOLAX Take 17 g by mouth daily as needed for mild constipation.   traMADol 50 MG tablet Commonly known as: Ultram Take 1 tablet (50 mg total) by mouth every 6 (six) hours as needed.         Signed: West Pugh. Baldwin Racicot   PA-C  09/04/2019, 10:29 AM

## 2019-09-05 DIAGNOSIS — Z4789 Encounter for other orthopedic aftercare: Secondary | ICD-10-CM | POA: Diagnosis not present

## 2019-09-05 DIAGNOSIS — I10 Essential (primary) hypertension: Secondary | ICD-10-CM | POA: Diagnosis not present

## 2019-09-05 DIAGNOSIS — S72142D Displaced intertrochanteric fracture of left femur, subsequent encounter for closed fracture with routine healing: Secondary | ICD-10-CM | POA: Diagnosis not present

## 2019-09-05 DIAGNOSIS — Z9181 History of falling: Secondary | ICD-10-CM | POA: Diagnosis not present

## 2019-09-10 DIAGNOSIS — S72142D Displaced intertrochanteric fracture of left femur, subsequent encounter for closed fracture with routine healing: Secondary | ICD-10-CM | POA: Diagnosis not present

## 2019-09-10 DIAGNOSIS — F0390 Unspecified dementia without behavioral disturbance: Secondary | ICD-10-CM | POA: Diagnosis not present

## 2019-09-10 DIAGNOSIS — R262 Difficulty in walking, not elsewhere classified: Secondary | ICD-10-CM | POA: Diagnosis not present

## 2019-09-10 DIAGNOSIS — Z4789 Encounter for other orthopedic aftercare: Secondary | ICD-10-CM | POA: Diagnosis not present

## 2019-09-12 DIAGNOSIS — S72142A Displaced intertrochanteric fracture of left femur, initial encounter for closed fracture: Secondary | ICD-10-CM | POA: Diagnosis not present

## 2019-09-12 DIAGNOSIS — Z4789 Encounter for other orthopedic aftercare: Secondary | ICD-10-CM | POA: Diagnosis not present

## 2019-10-09 DIAGNOSIS — Z4789 Encounter for other orthopedic aftercare: Secondary | ICD-10-CM | POA: Diagnosis not present

## 2019-10-10 DIAGNOSIS — Z4789 Encounter for other orthopedic aftercare: Secondary | ICD-10-CM | POA: Diagnosis not present

## 2019-10-10 DIAGNOSIS — I10 Essential (primary) hypertension: Secondary | ICD-10-CM | POA: Diagnosis not present

## 2019-10-10 DIAGNOSIS — Z9181 History of falling: Secondary | ICD-10-CM | POA: Diagnosis not present

## 2019-10-10 DIAGNOSIS — F0390 Unspecified dementia without behavioral disturbance: Secondary | ICD-10-CM | POA: Diagnosis not present

## 2019-10-13 DIAGNOSIS — I10 Essential (primary) hypertension: Secondary | ICD-10-CM | POA: Diagnosis not present

## 2019-10-13 DIAGNOSIS — R32 Unspecified urinary incontinence: Secondary | ICD-10-CM | POA: Diagnosis not present

## 2019-10-13 DIAGNOSIS — L89154 Pressure ulcer of sacral region, stage 4: Secondary | ICD-10-CM | POA: Diagnosis not present

## 2019-10-13 DIAGNOSIS — H409 Unspecified glaucoma: Secondary | ICD-10-CM | POA: Diagnosis not present

## 2019-10-13 DIAGNOSIS — Z9181 History of falling: Secondary | ICD-10-CM | POA: Diagnosis not present

## 2019-10-13 DIAGNOSIS — D649 Anemia, unspecified: Secondary | ICD-10-CM | POA: Diagnosis not present

## 2019-10-13 DIAGNOSIS — F0390 Unspecified dementia without behavioral disturbance: Secondary | ICD-10-CM | POA: Diagnosis not present

## 2019-10-13 DIAGNOSIS — S72142D Displaced intertrochanteric fracture of left femur, subsequent encounter for closed fracture with routine healing: Secondary | ICD-10-CM | POA: Diagnosis not present

## 2019-10-14 DIAGNOSIS — R627 Adult failure to thrive: Secondary | ICD-10-CM | POA: Diagnosis not present

## 2019-10-14 DIAGNOSIS — R918 Other nonspecific abnormal finding of lung field: Secondary | ICD-10-CM | POA: Diagnosis not present

## 2019-10-14 DIAGNOSIS — Z66 Do not resuscitate: Secondary | ICD-10-CM | POA: Diagnosis not present

## 2019-10-14 DIAGNOSIS — S7292XD Unspecified fracture of left femur, subsequent encounter for closed fracture with routine healing: Secondary | ICD-10-CM | POA: Diagnosis not present

## 2019-10-14 DIAGNOSIS — I10 Essential (primary) hypertension: Secondary | ICD-10-CM | POA: Diagnosis not present

## 2019-10-14 DIAGNOSIS — G301 Alzheimer's disease with late onset: Secondary | ICD-10-CM | POA: Diagnosis not present

## 2019-10-15 DIAGNOSIS — L89154 Pressure ulcer of sacral region, stage 4: Secondary | ICD-10-CM | POA: Diagnosis not present

## 2019-10-15 DIAGNOSIS — S72142D Displaced intertrochanteric fracture of left femur, subsequent encounter for closed fracture with routine healing: Secondary | ICD-10-CM | POA: Diagnosis not present

## 2019-10-15 DIAGNOSIS — H409 Unspecified glaucoma: Secondary | ICD-10-CM | POA: Diagnosis not present

## 2019-10-15 DIAGNOSIS — I10 Essential (primary) hypertension: Secondary | ICD-10-CM | POA: Diagnosis not present

## 2019-10-15 DIAGNOSIS — D649 Anemia, unspecified: Secondary | ICD-10-CM | POA: Diagnosis not present

## 2019-10-15 DIAGNOSIS — F0390 Unspecified dementia without behavioral disturbance: Secondary | ICD-10-CM | POA: Diagnosis not present

## 2019-10-16 DIAGNOSIS — H409 Unspecified glaucoma: Secondary | ICD-10-CM | POA: Diagnosis not present

## 2019-10-16 DIAGNOSIS — Z741 Need for assistance with personal care: Secondary | ICD-10-CM | POA: Diagnosis not present

## 2019-10-16 DIAGNOSIS — G301 Alzheimer's disease with late onset: Secondary | ICD-10-CM | POA: Diagnosis not present

## 2019-10-16 DIAGNOSIS — I10 Essential (primary) hypertension: Secondary | ICD-10-CM | POA: Diagnosis not present

## 2019-10-16 DIAGNOSIS — R159 Full incontinence of feces: Secondary | ICD-10-CM | POA: Diagnosis not present

## 2019-10-16 DIAGNOSIS — S72002D Fracture of unspecified part of neck of left femur, subsequent encounter for closed fracture with routine healing: Secondary | ICD-10-CM | POA: Diagnosis not present

## 2019-10-16 DIAGNOSIS — Z7401 Bed confinement status: Secondary | ICD-10-CM | POA: Diagnosis not present

## 2019-10-16 DIAGNOSIS — R911 Solitary pulmonary nodule: Secondary | ICD-10-CM | POA: Diagnosis not present

## 2019-10-16 DIAGNOSIS — F0281 Dementia in other diseases classified elsewhere with behavioral disturbance: Secondary | ICD-10-CM | POA: Diagnosis not present

## 2019-10-16 DIAGNOSIS — R32 Unspecified urinary incontinence: Secondary | ICD-10-CM | POA: Diagnosis not present

## 2019-10-16 DIAGNOSIS — Z681 Body mass index (BMI) 19 or less, adult: Secondary | ICD-10-CM | POA: Diagnosis not present

## 2019-10-16 DIAGNOSIS — L89154 Pressure ulcer of sacral region, stage 4: Secondary | ICD-10-CM | POA: Diagnosis not present

## 2019-10-17 IMAGING — CR DG THORACIC SPINE 2V
4 series · 4 of 4 positions shown · non-contrast
Comparison: None.

CLINICAL DATA: Trauma secondary to a fall.

EXAM:
THORACIC SPINE 2 VIEWS

[t thoracic spine ap]
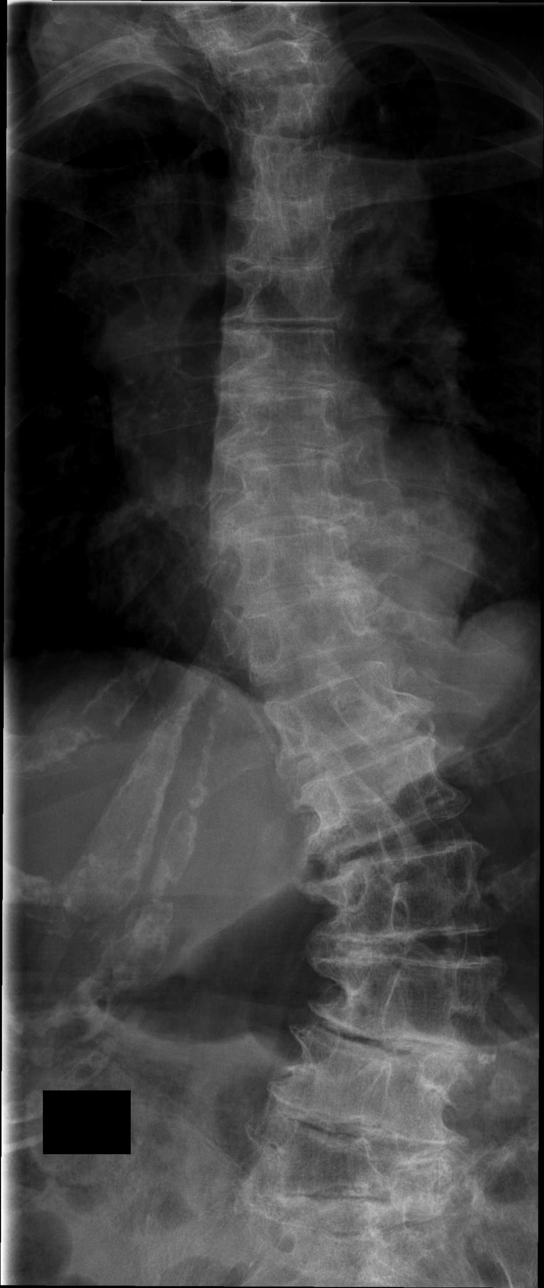

[t thoracic spine lat]
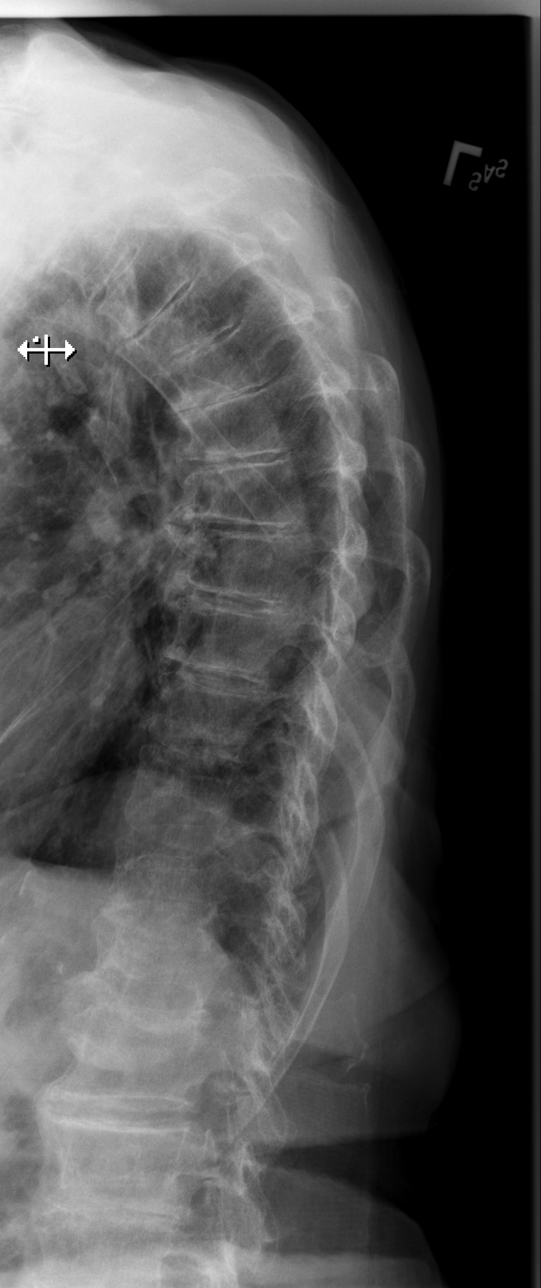

[t thoracic swimmers (1 of 2)]
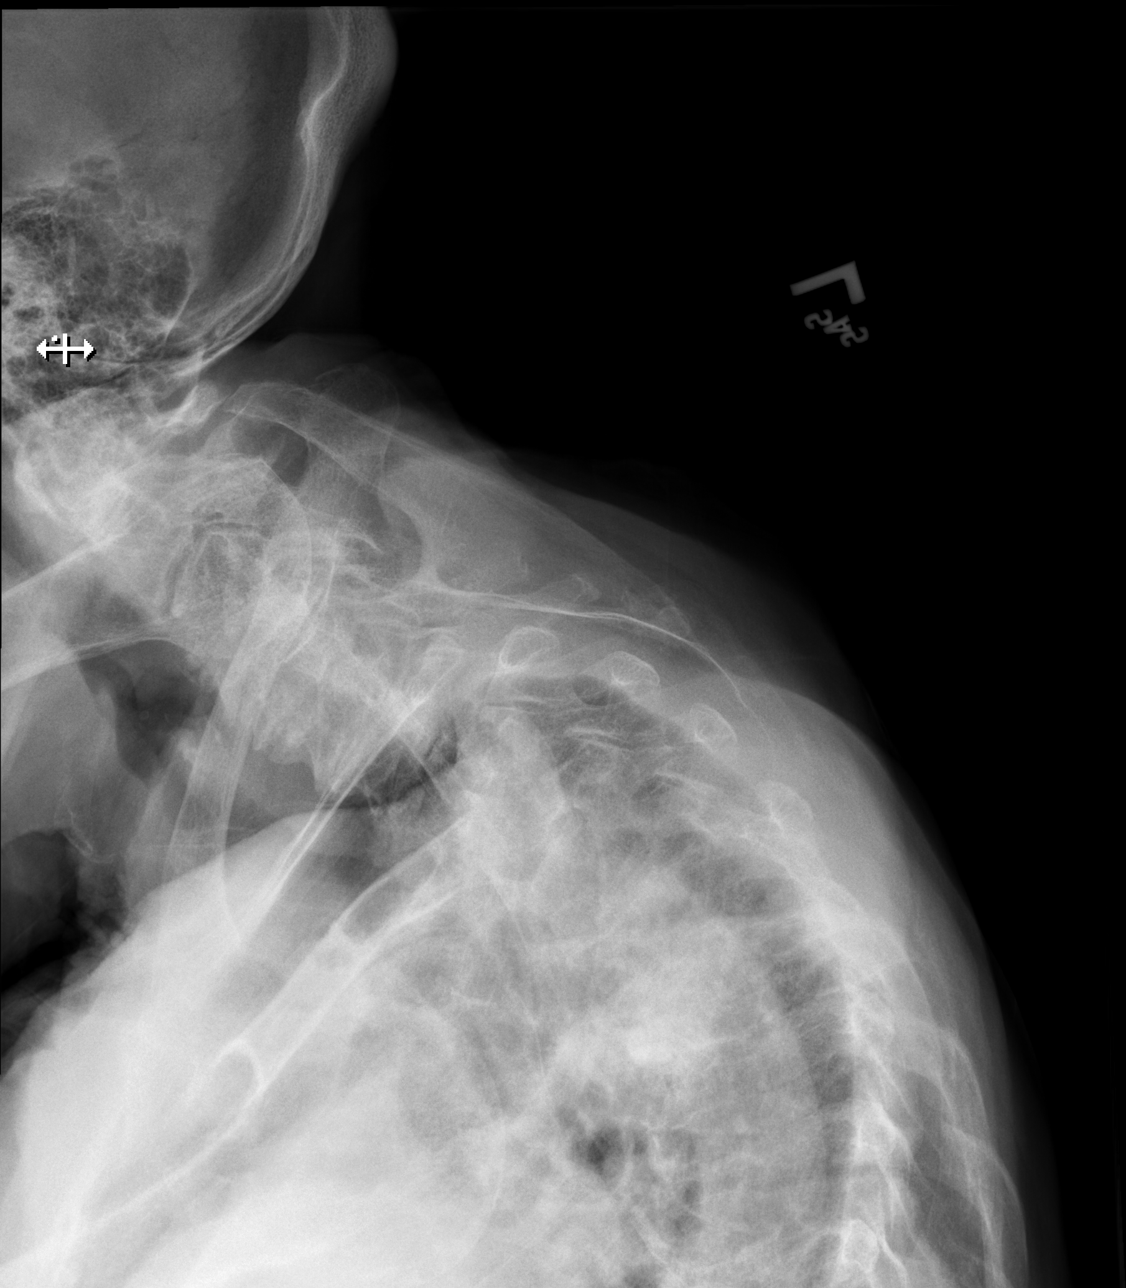

[t thoracic swimmers (2 of 2)]
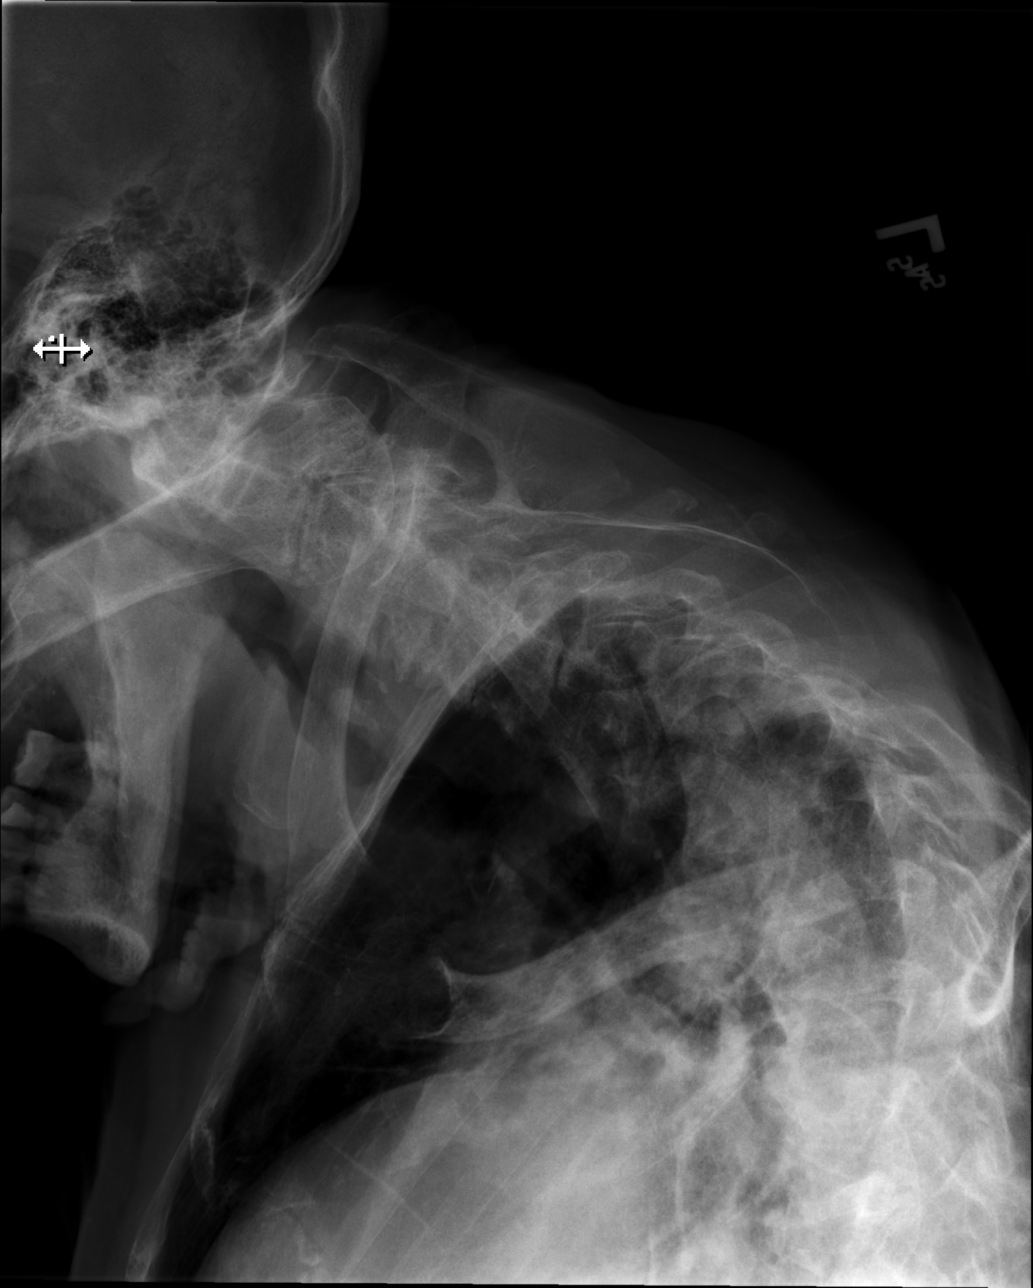

[4 of 4 positions shown; findings below may reference images not displayed]

FINDINGS: There is a thoracolumbar scoliosis. Accentuation of the upper
thoracic kyphosis. Slight old anterior wedge deformity of an upper
thoracic vertebra. Disc space narrowing throughout the upper
thoracic spine.

There is soft tissue fullness to the right of the trachea and right
mainstem bronchus. The patient is slightly a rotated in these could
represent vascular structures but I recommend a AP or PA chest x-ray
for further evaluation.
IMPRESSION: No acute abnormality of the thoracic spine. Old mild compression
deformity in the upper thoracic spine.

Prominent soft tissues at the right side of the superior
mediastinum. PA or AP chest x-ray recommended for further
evaluation.

## 2019-10-17 IMAGING — CR DG CHEST 2V
3 series · 3 of 3 positions shown · non-contrast
Comparison: Thoracic spine films from earlier today.

CLINICAL DATA: Abnormal x-ray.

EXAM:
CHEST - 2 VIEW

[x chest ap (1 of 2)]
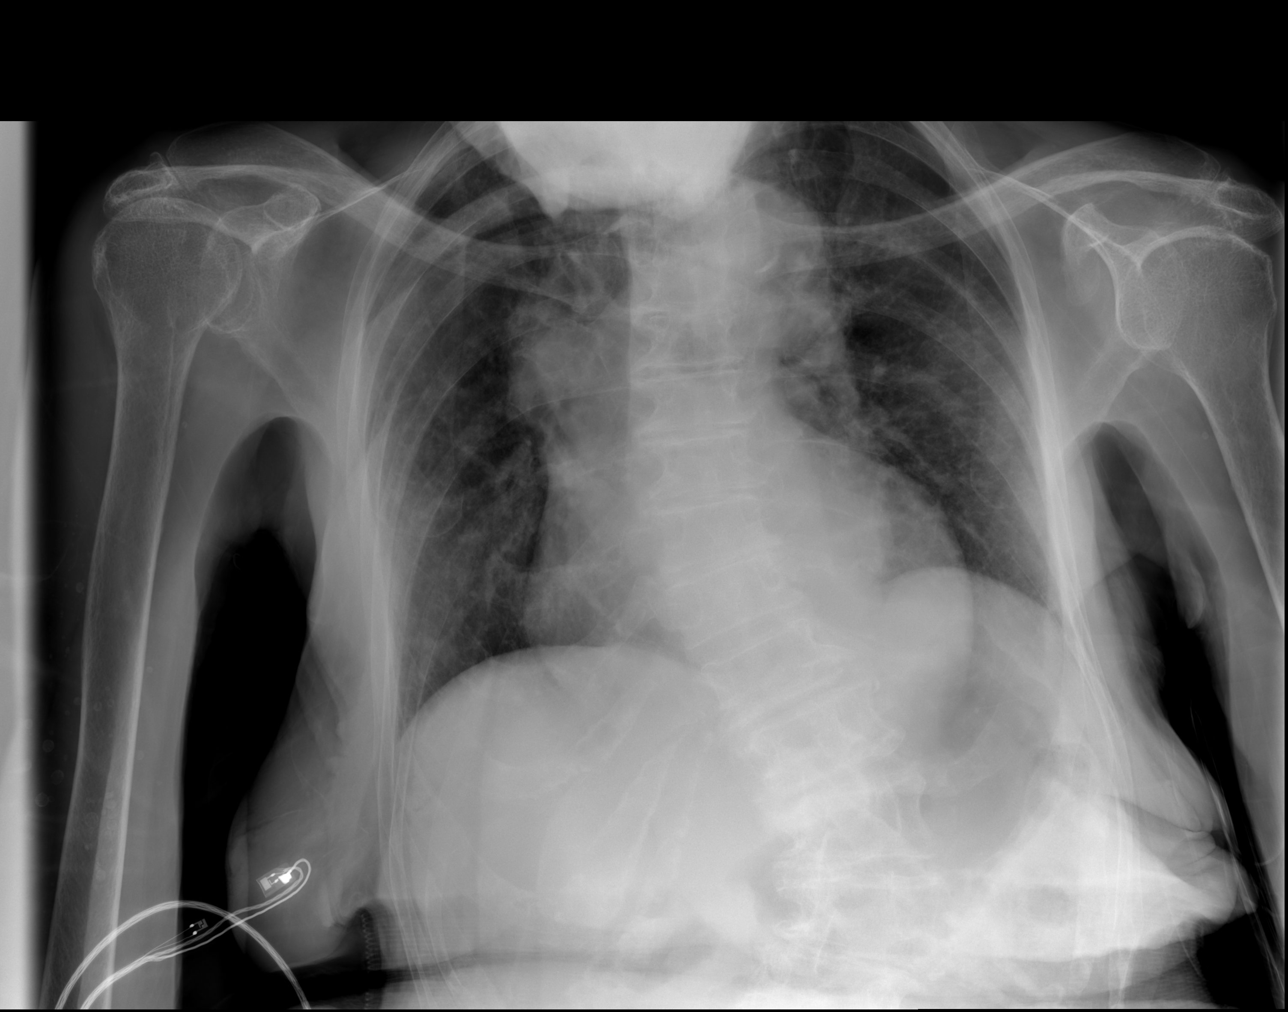

[x chest ap (2 of 2)]
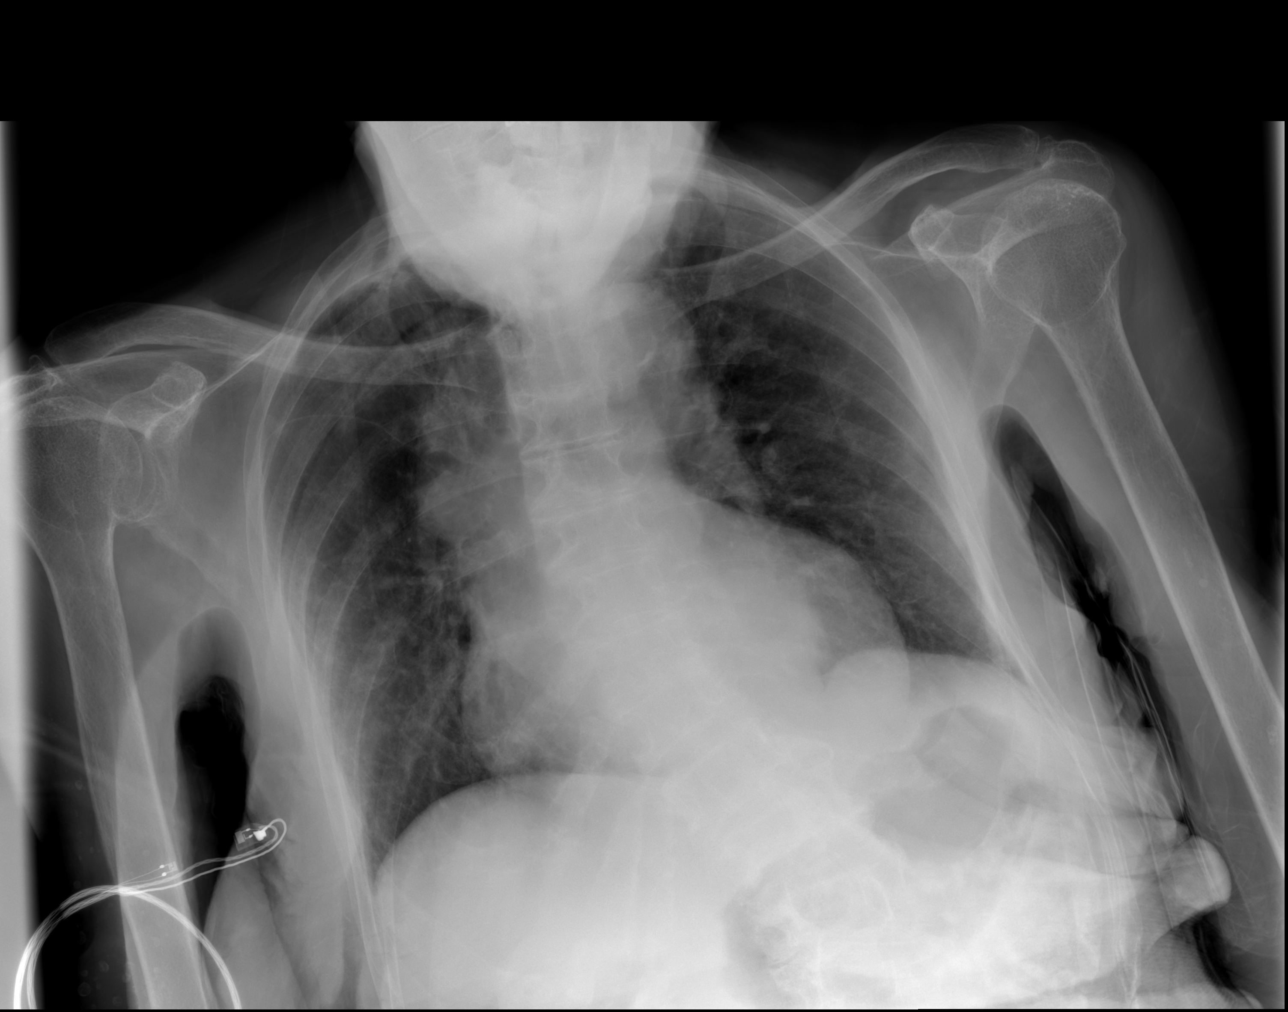

[w chest lat]
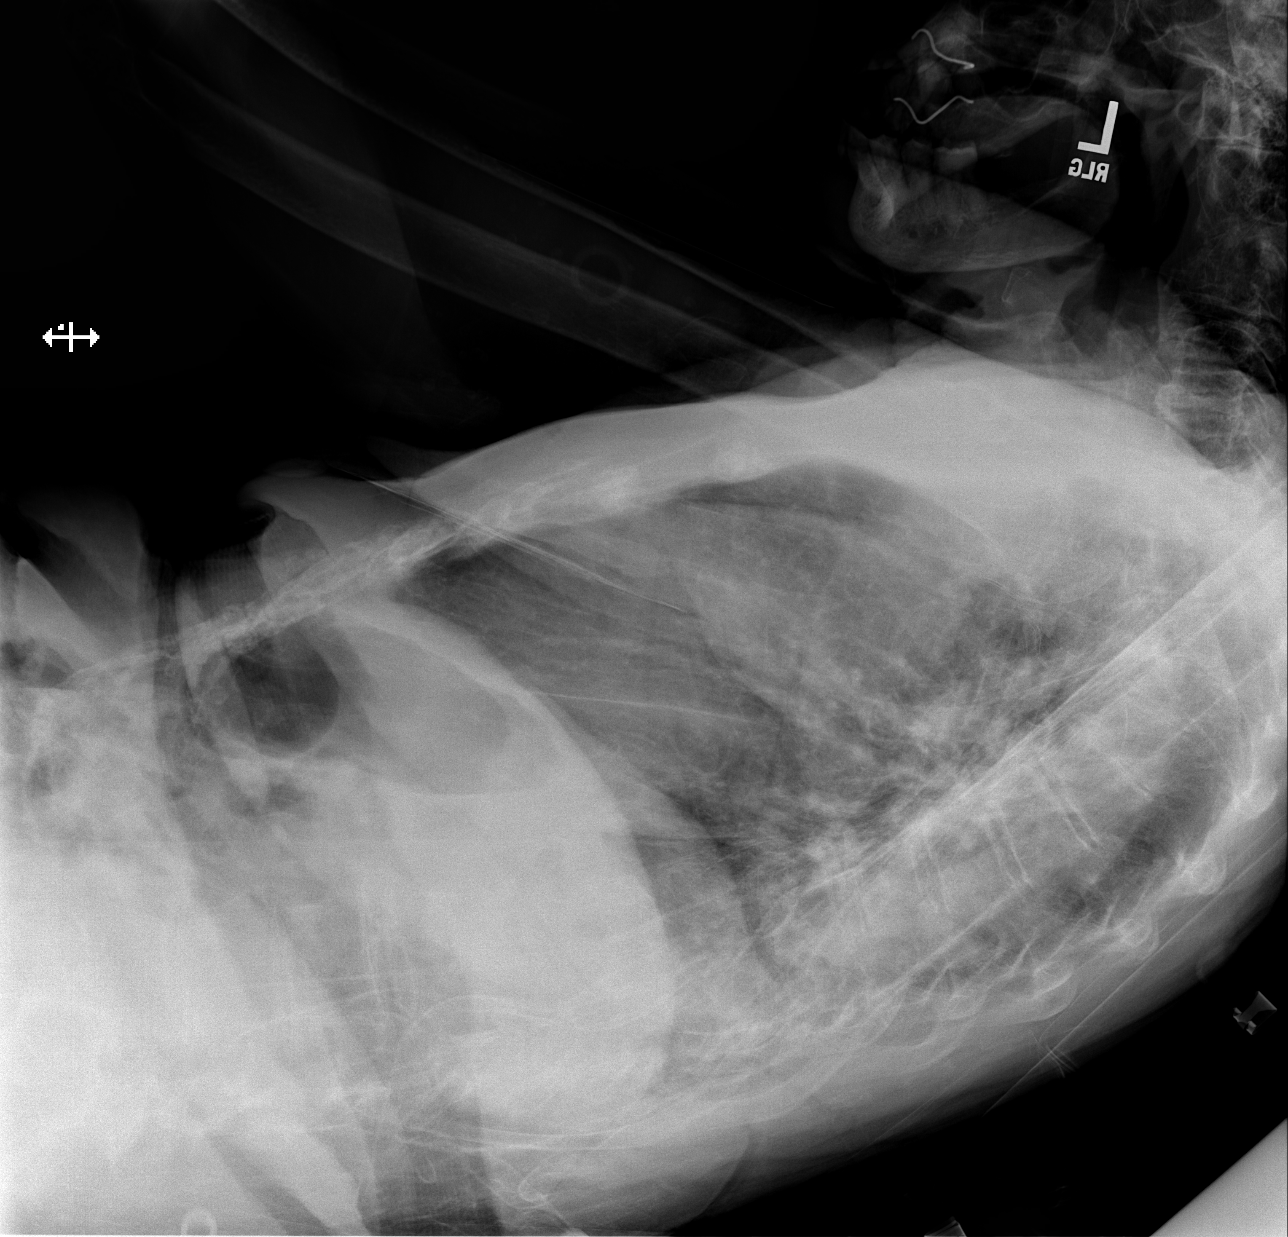

[3 of 3 positions shown; findings below may reference images not displayed]

FINDINGS: Two views study is limited by positioning. The cardio pericardial
silhouette is enlarged. Right suprahilar fullness again noted.
Interstitial markings are diffusely coarsened with chronic features.
The visualized bony structures of the thorax are intact.
IMPRESSION: Cardiomegaly with right suprahilar soft tissue fullness. CT chest
with contrast recommended to further evaluate.

## 2019-11-14 DEATH — deceased
